# Patient Record
Sex: Female | Born: 2000 | Race: Black or African American | Hispanic: No | Marital: Married | Smoking: Never smoker
Health system: Southern US, Community
[De-identification: ages and names within clinical notes are randomized; demographics above are authoritative.]

## PROBLEM LIST (undated history)

## (undated) DIAGNOSIS — M2140 Flat foot [pes planus] (acquired), unspecified foot: Secondary | ICD-10-CM

## (undated) HISTORY — DX: Flat foot (pes planus) (acquired), unspecified foot: M21.40

## (undated) HISTORY — PX: OTHER SURGICAL HISTORY: SHX169

---
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
## 2018-02-25 DIAGNOSIS — N926 Irregular menstruation, unspecified: Secondary | ICD-10-CM | POA: Diagnosis not present

## 2018-02-25 DIAGNOSIS — Z3202 Encounter for pregnancy test, result negative: Secondary | ICD-10-CM | POA: Diagnosis not present

## 2018-02-25 DIAGNOSIS — Z6825 Body mass index (BMI) 25.0-25.9, adult: Secondary | ICD-10-CM | POA: Diagnosis not present

## 2018-03-26 DIAGNOSIS — Z6825 Body mass index (BMI) 25.0-25.9, adult: Secondary | ICD-10-CM | POA: Diagnosis not present

## 2018-03-26 DIAGNOSIS — N939 Abnormal uterine and vaginal bleeding, unspecified: Secondary | ICD-10-CM | POA: Diagnosis not present

## 2018-10-24 IMAGING — DX DG CHEST 2V
2 series · 2 of 2 positions shown · non-contrast
Comparison: Report 07/08/2000

CLINICAL DATA: Chest pain, sharp in central

EXAM:
CHEST  2 VIEW

[chest pa]
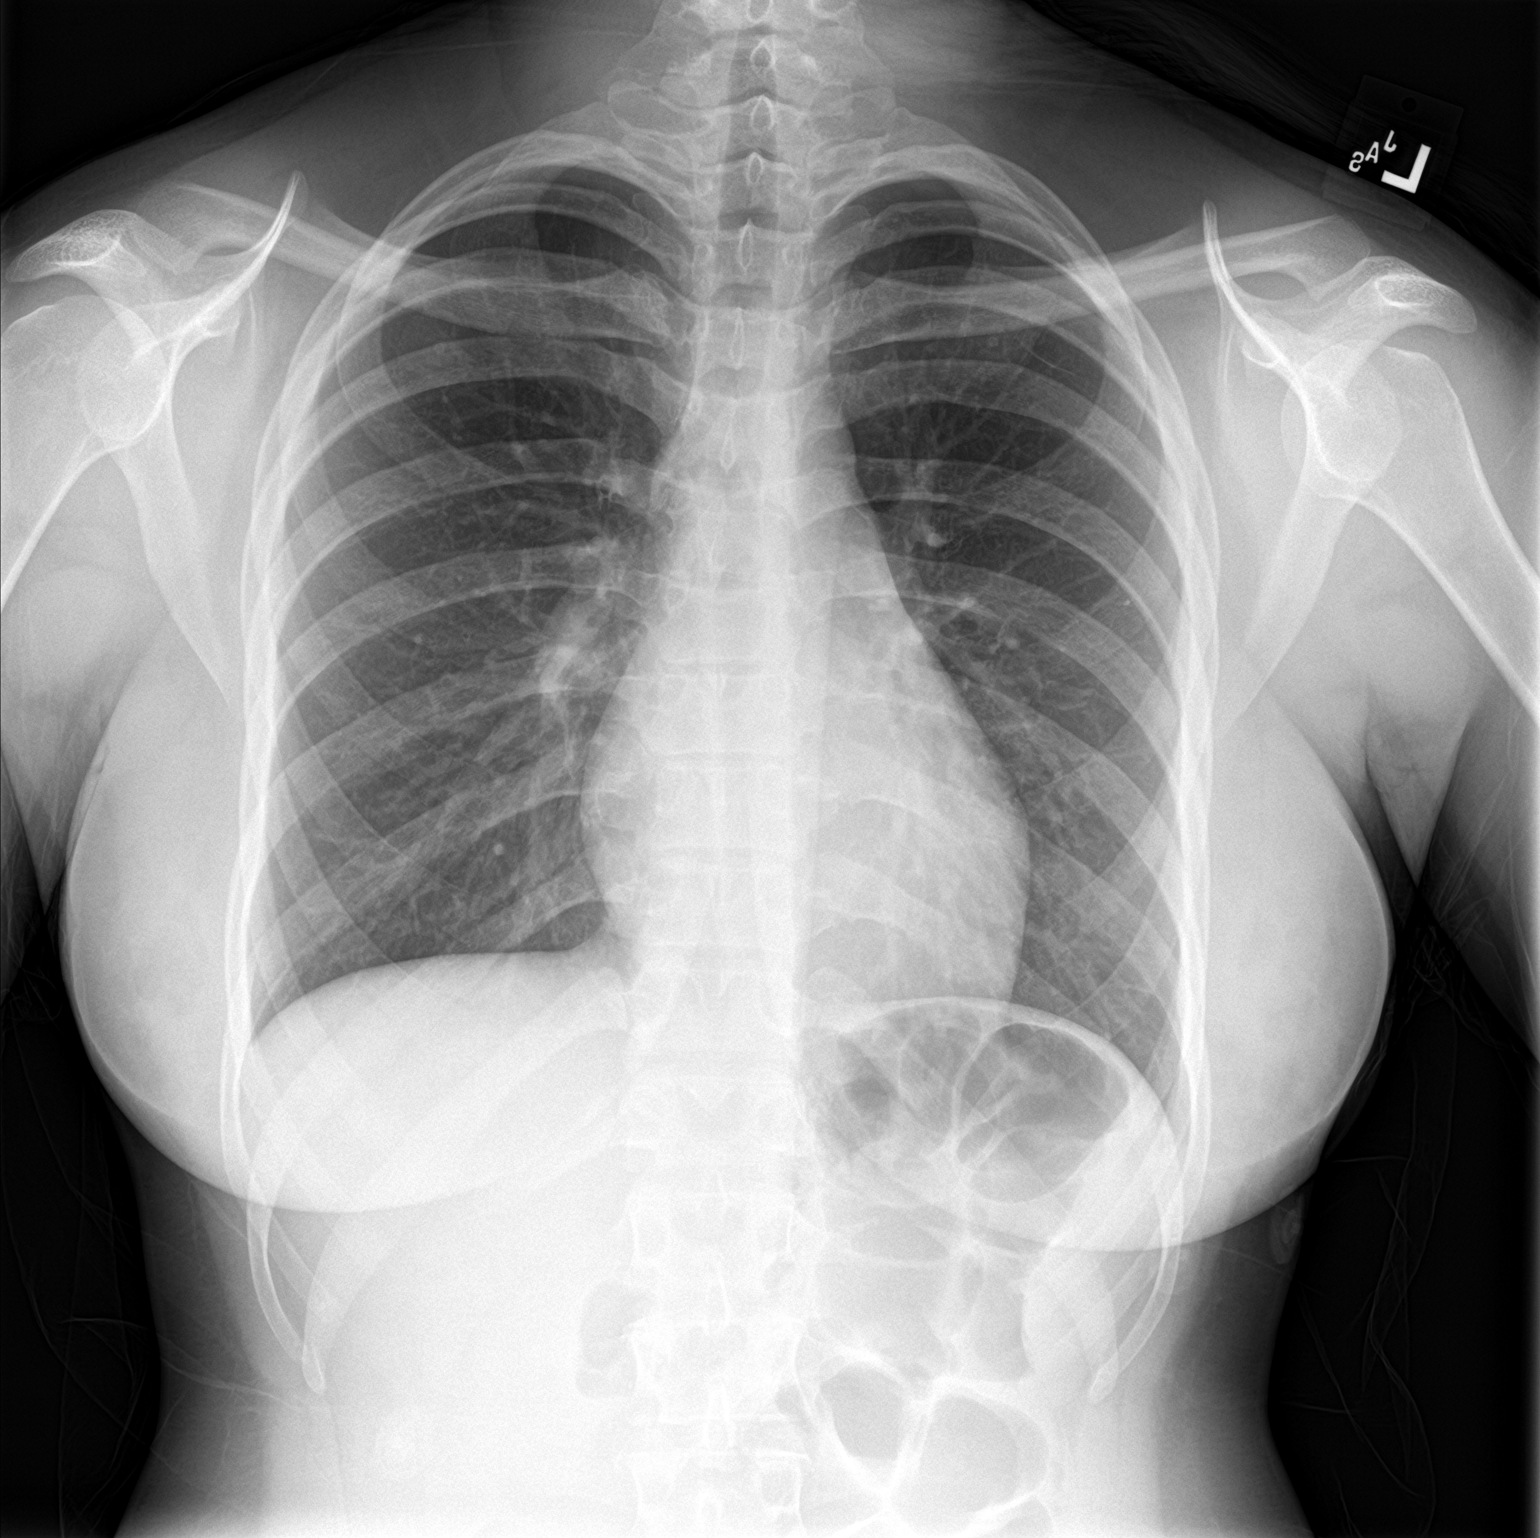

[chest lat]
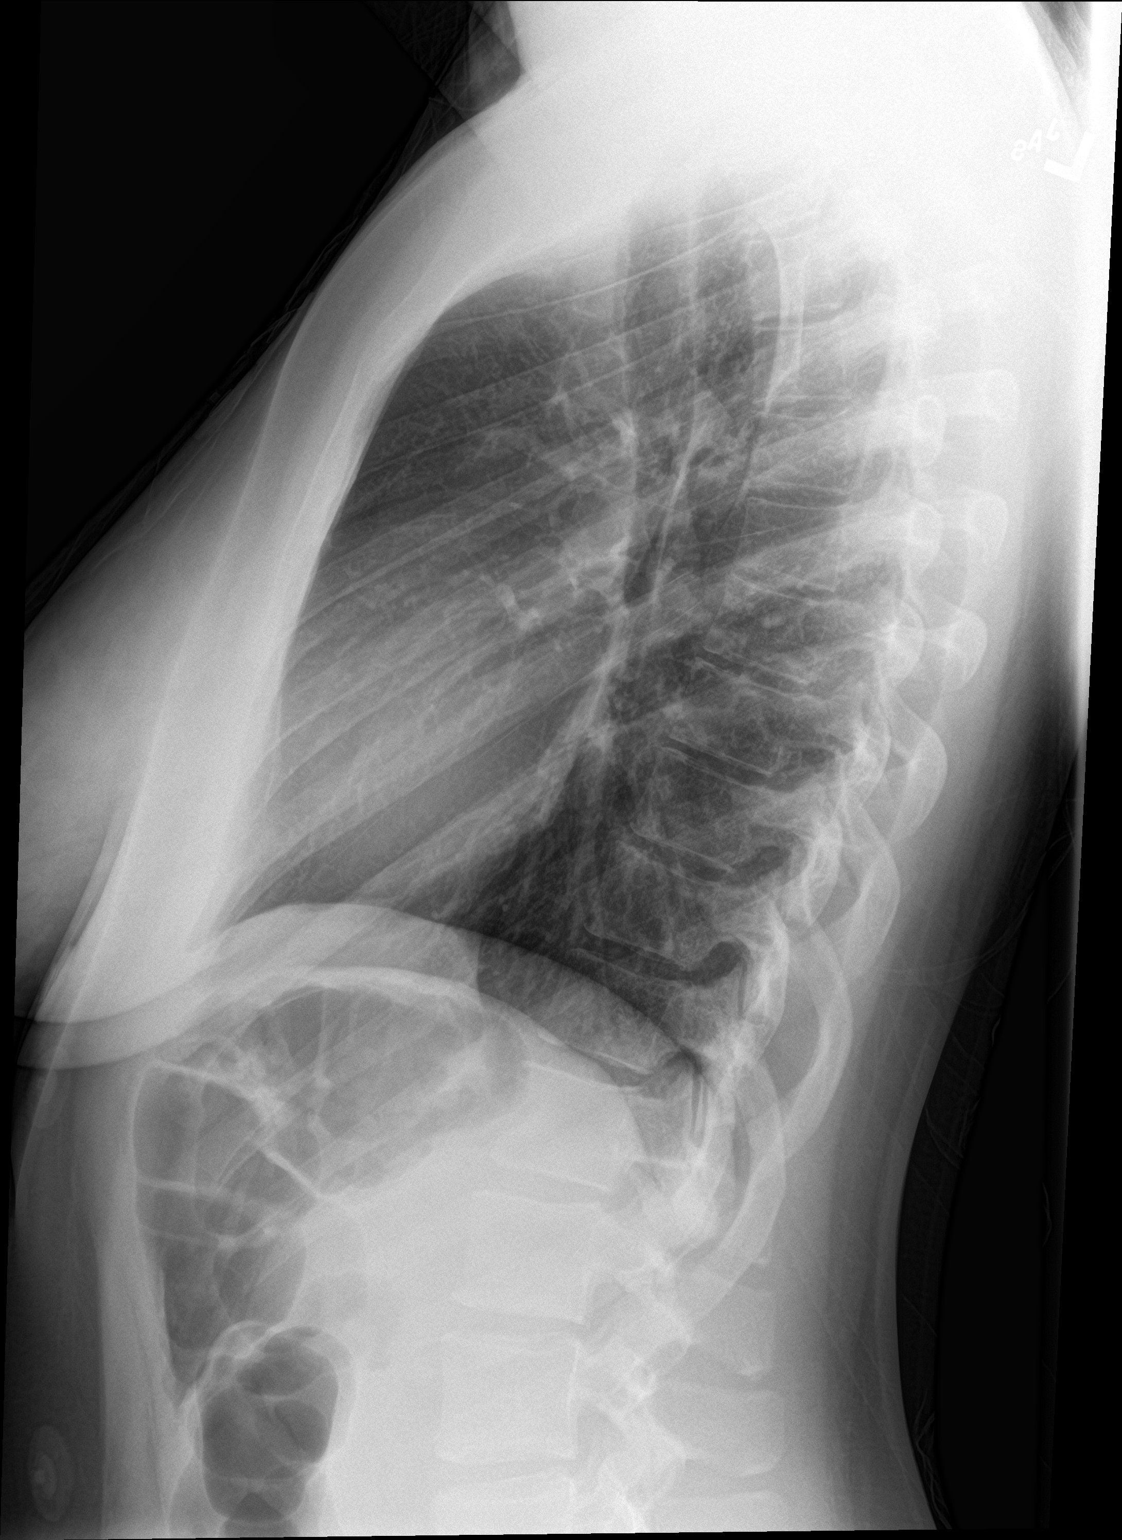

[2 of 2 positions shown; findings below may reference images not displayed]

FINDINGS: The heart size and mediastinal contours are within normal limits.
Both lungs are clear. The visualized skeletal structures are
unremarkable.
IMPRESSION: No active cardiopulmonary disease.

## 2018-12-03 DIAGNOSIS — Z118 Encounter for screening for other infectious and parasitic diseases: Secondary | ICD-10-CM | POA: Diagnosis not present

## 2018-12-03 DIAGNOSIS — Z3169 Encounter for other general counseling and advice on procreation: Secondary | ICD-10-CM | POA: Diagnosis not present

## 2018-12-03 DIAGNOSIS — N970 Female infertility associated with anovulation: Secondary | ICD-10-CM | POA: Diagnosis not present

## 2018-12-03 DIAGNOSIS — Z6825 Body mass index (BMI) 25.0-25.9, adult: Secondary | ICD-10-CM | POA: Diagnosis not present

## 2018-12-08 IMAGING — CT CT ABD-PELV W/O CM
2 of 4 series · 15 of 46 positions shown, 17 images · non-contrast
Comparison: None.

CLINICAL DATA: Right lower quadrant pain, vomiting.

EXAM:
CT ABDOMEN AND PELVIS WITHOUT CONTRAST
TECHNIQUE: Multidetector CT imaging of the abdomen and pelvis was performed
following the standard protocol without IV contrast.

[Series 3: renal stone 5mm · axial · 0.59mm/px · z∈[+645,+1010]mm · 12 of 87 slices shown, 14 images]
[im 7/87  soft-tissue]
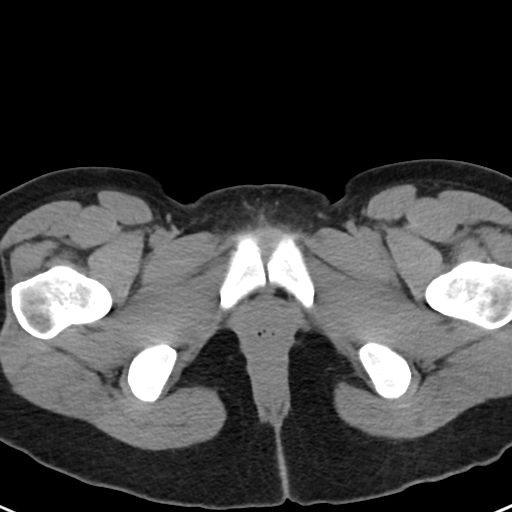
[im 7/87  bone]
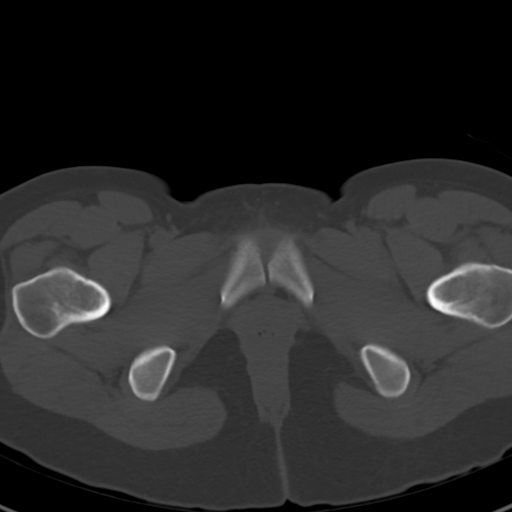
[im 14/87  soft-tissue]
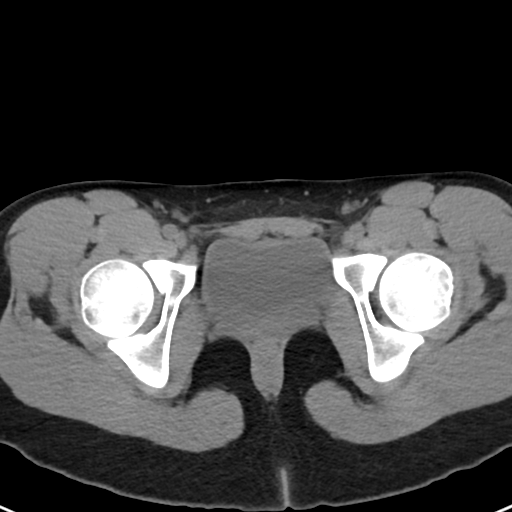
[im 20/87  soft-tissue]
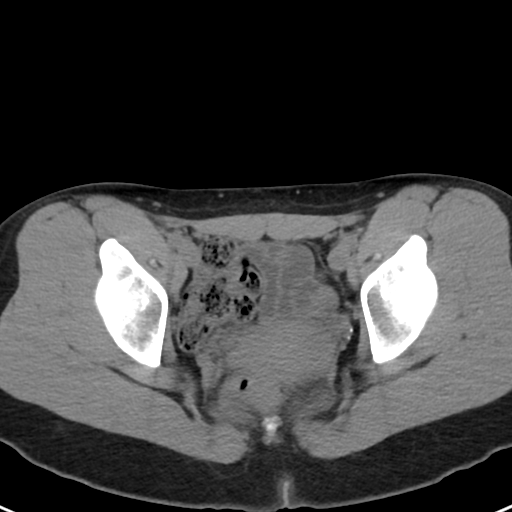
[im 27/87  soft-tissue]
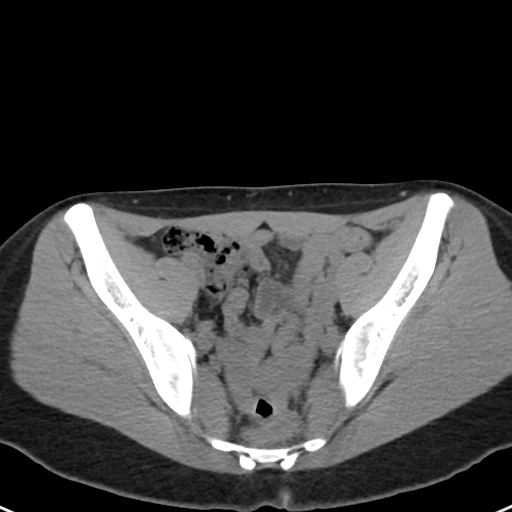
[im 34/87  soft-tissue]
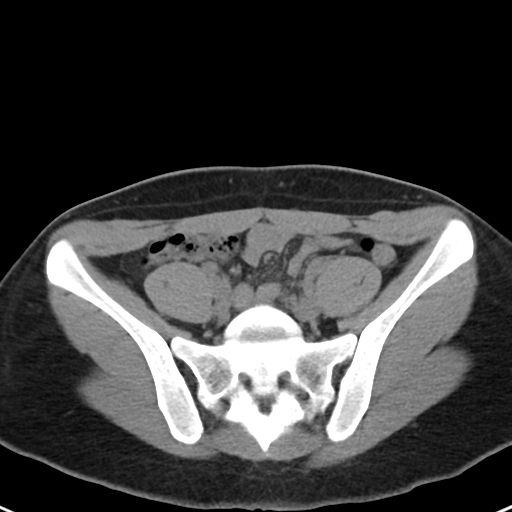
[im 40/87  soft-tissue]
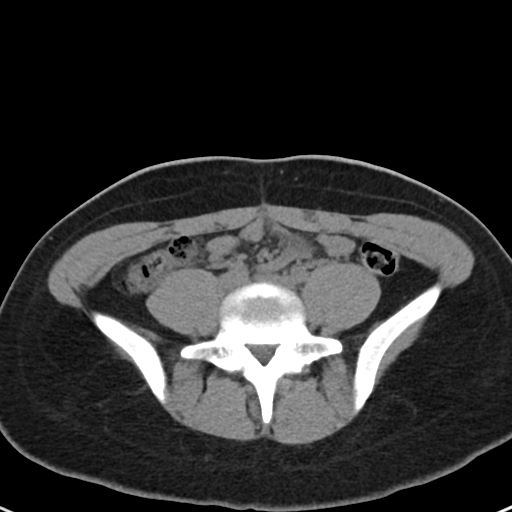
[im 47/87  soft-tissue]
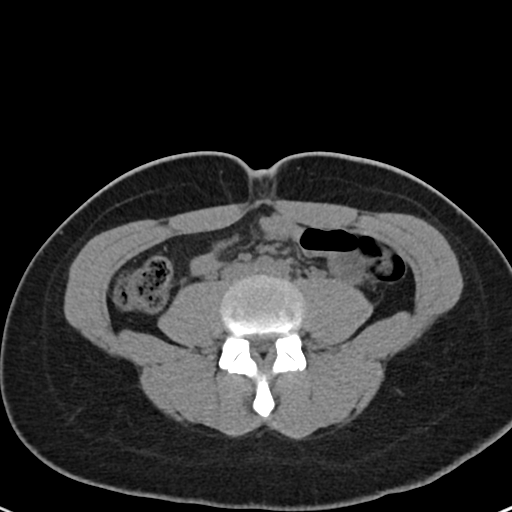
[im 53/87  soft-tissue]
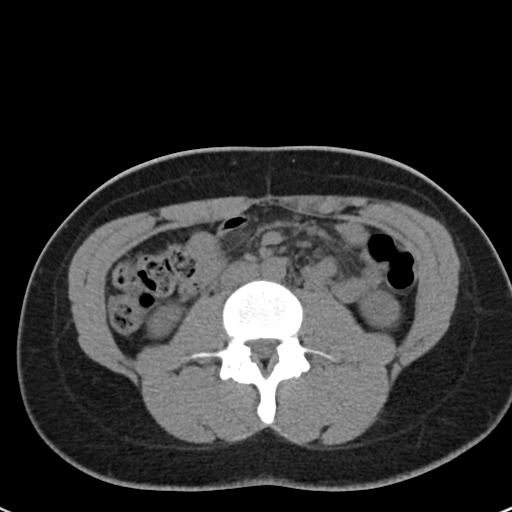
[im 60/87  soft-tissue]
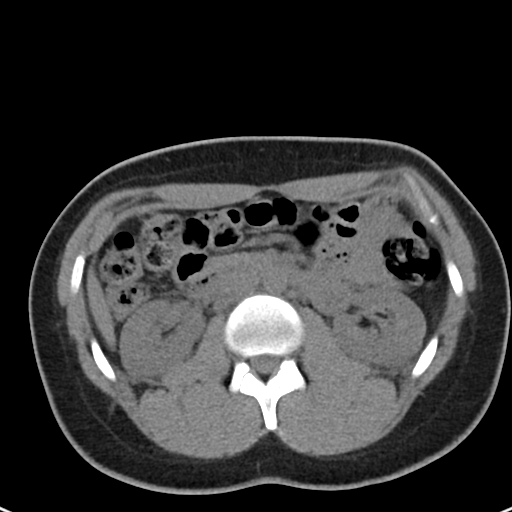
[im 60/87  bone]
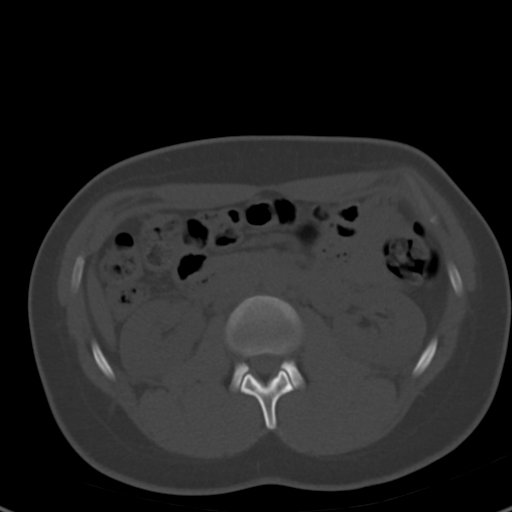
[im 67/87  soft-tissue]
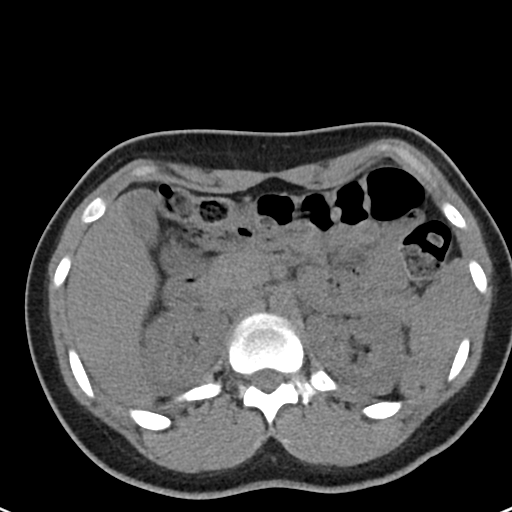
[im 73/87  soft-tissue]
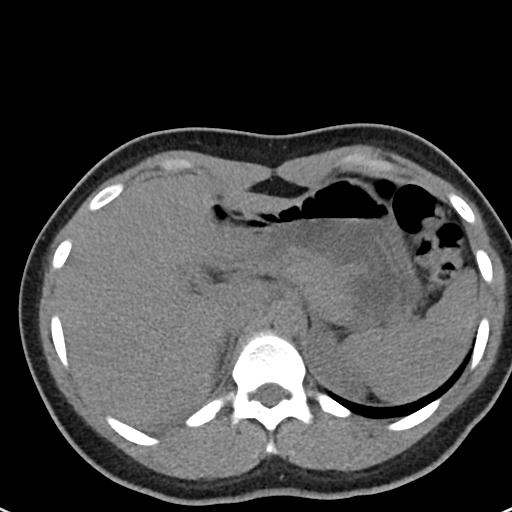
[im 80/87  soft-tissue]
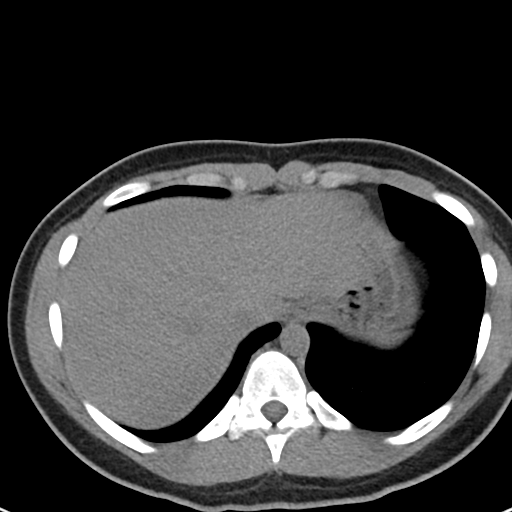

[Series 5: renal stone 3.0 cor · coronal · 0.59mm/px · 3 of 74 slices shown]
[im 25/74  soft-tissue]
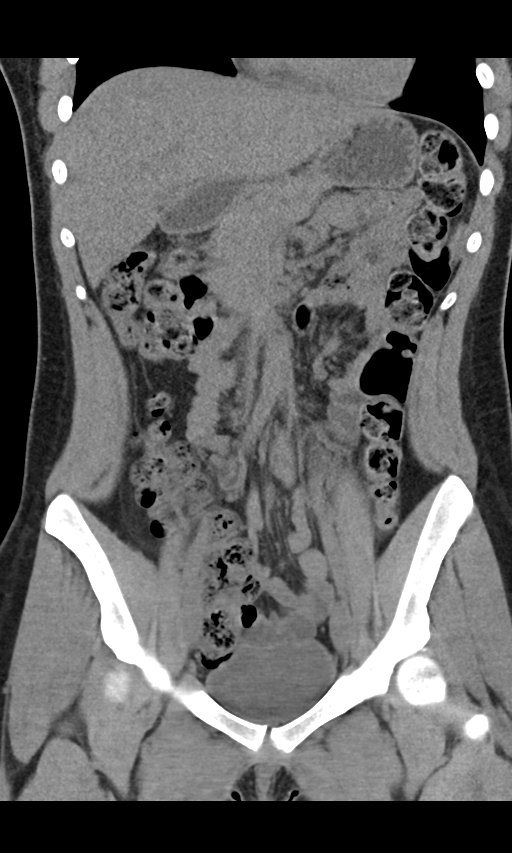
[im 33/74  soft-tissue]
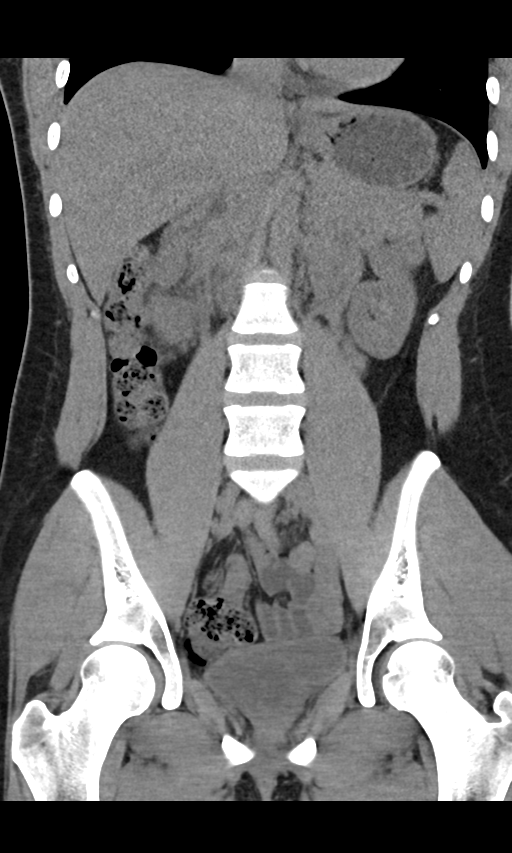
[im 41/74  soft-tissue]
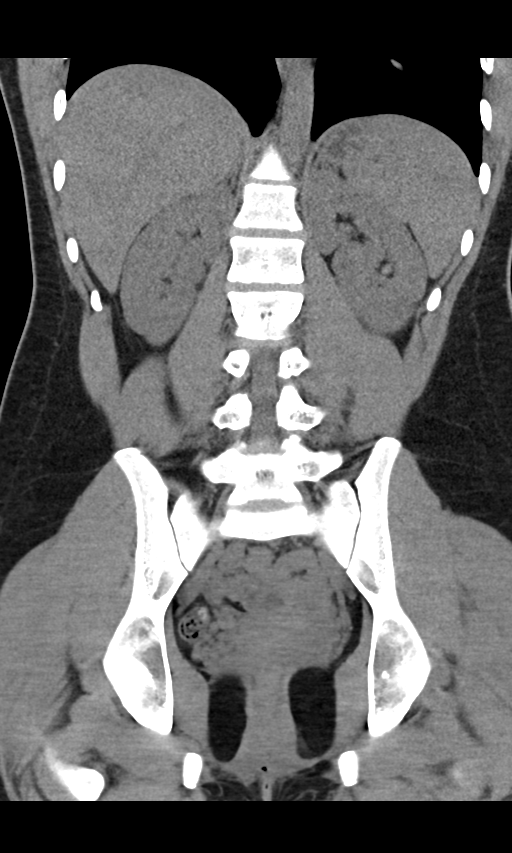

[15 of 46 positions shown; findings below may reference images not displayed]

FINDINGS: Lower chest: Lung bases are clear. No effusions. Heart is normal
size.

Hepatobiliary: No focal hepatic abnormality. Gallbladder
unremarkable.

Pancreas: No focal abnormality or ductal dilatation.

Spleen: No focal abnormality.  Normal size.

Adrenals/Urinary Tract: No adrenal abnormality. No focal renal
abnormality. No stones or hydronephrosis. Urinary bladder is
unremarkable.

Stomach/Bowel: Appendix is visualized and is no. Stomach, large and
small bowel grossly unremarkable.

Vascular/Lymphatic: No evidence of aneurysm or adenopathy.

Reproductive: Uterus and adnexa unremarkable.  No mass.

Other: No free fluid or free air.

Musculoskeletal: No acute bony abnormality or focal bone lesion.
IMPRESSION: No renal or ureteral stones.  No hydronephrosis.

Normal appendix.

No acute findings in the abdomen or pelvis.

## 2019-03-11 ENCOUNTER — Other Ambulatory Visit: Payer: Self-pay

## 2019-03-11 DIAGNOSIS — Z20822 Contact with and (suspected) exposure to covid-19: Secondary | ICD-10-CM

## 2019-03-12 LAB — NOVEL CORONAVIRUS, NAA: SARS-CoV-2, NAA: NOT DETECTED

## 2019-03-27 ENCOUNTER — Ambulatory Visit (INDEPENDENT_AMBULATORY_CARE_PROVIDER_SITE_OTHER): Payer: BC Managed Care – PPO | Admitting: Family Medicine

## 2019-03-27 ENCOUNTER — Other Ambulatory Visit: Payer: Self-pay

## 2019-03-27 ENCOUNTER — Encounter: Payer: Self-pay | Admitting: Family Medicine

## 2019-03-27 VITALS — BP 124/78 | HR 65 | Temp 97.9°F | Ht 65.5 in | Wt 156.6 lb

## 2019-03-27 DIAGNOSIS — L700 Acne vulgaris: Secondary | ICD-10-CM | POA: Diagnosis not present

## 2019-03-27 DIAGNOSIS — L309 Dermatitis, unspecified: Secondary | ICD-10-CM

## 2019-03-27 DIAGNOSIS — Z23 Encounter for immunization: Secondary | ICD-10-CM | POA: Diagnosis not present

## 2019-03-27 MED ORDER — TRIAMCINOLONE ACETONIDE 0.1 % EX CREA: 1.0000 "application " | TOPICAL_CREAM | Freq: Two times a day (BID) | CUTANEOUS | 0 refills | Status: DC

## 2019-03-27 NOTE — Progress Notes (Signed)
Subjective:    Patient ID: Kristen Cain, female    DOB: 07-02-2000, 18 y.o.   MRN: [id number]  HPI Pt presents requesting a dermatology ref  Also needs a flu shot   Smoking status -she smokes now and then- marijuana  Not dependent  No cigarettes  Getting some eczema  Areas itch and burn   Dry patches on hands - get hypopigmented  Lotion- any kind - tries to stay moisturize Uses dove soap  Does take hot showers   No allergies or asthma  (just asthma as a kid)  Has never had eczema before   She would also like to address dark spots on face  She scars easily  Had acne growing up (all on face)  Gets it now and then   Patient Active Problem List   Diagnosis Date Noted  . Eczema of both hands 03/27/2019  . Acne vulgaris 03/27/2019  . Nausea 10/25/2017  . Routine general medical examination at a health care facility 01/02/2014  . Screen for STD (sexually transmitted disease) 01/02/2014  . Menorrhagia with irregular cycle 01/02/2014  . PES PLANUS 11/29/2009   Past Medical History:  Diagnosis Date  . Asthma   . 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)    Past Surgical History:  Procedure Laterality Date  . toe nail     Social History   Tobacco Use  . Smoking status: Never Smoker  . Smokeless tobacco: Never Used  Substance Use Topics  . Alcohol use: Yes    Alcohol/week: 0.0 standard drinks    Comment: occ  . Drug use: Yes    Types: Marijuana   Family History  Problem Relation Age of Onset  . Hypertension Father   . Hypertension Mother   . Diabetes Mother        borderline   No Known Allergies No current outpatient medications on file prior to visit.   No current facility-administered medications on file prior to visit.     Review of Systems  Constitutional: Negative for activity change, appetite change, fatigue, fever and unexpected weight change.  HENT: Negative for congestion, ear pain, rhinorrhea, sinus pressure and sore throat.   Eyes: Negative for pain, redness and  visual disturbance.  Respiratory: Negative for cough, shortness of breath and wheezing.   Cardiovascular: Negative for chest pain and palpitations.  Gastrointestinal: Negative for abdominal pain, blood in stool, constipation and diarrhea.  Endocrine: Negative for polydipsia and polyuria.  Genitourinary: Negative for dysuria, frequency and urgency.  Musculoskeletal: Negative for arthralgias, back pain and myalgias.  Skin: Positive for rash. Negative for pallor.  Allergic/Immunologic: Negative for environmental allergies.  Neurological: Negative for dizziness, syncope and headaches.  Hematological: Negative for adenopathy. Does not bruise/bleed easily.  Psychiatric/Behavioral: Negative for decreased concentration and dysphoric mood. The patient is not nervous/anxious.        Objective:   Physical Exam Constitutional:      General: She is not in acute distress.    Appearance: Normal appearance. She is normal weight. She is not ill-appearing.  HENT:     Head: Normocephalic and atraumatic.     Mouth/Throat:     Mouth: Mucous membranes are moist.  Eyes:     General: No scleral icterus.    Extraocular Movements: Extraocular movements intact.     Conjunctiva/sclera: Conjunctivae normal.     Pupils: Pupils are equal, round, and reactive to light.  Neck:     Musculoskeletal: Normal range of motion and neck supple.  Cardiovascular:  Rate and Rhythm: Normal rate and regular rhythm.  Pulmonary:     Effort: Pulmonary effort is normal. No respiratory distress.     Breath sounds: Normal breath sounds. No wheezing.  Lymphadenopathy:     Cervical: No cervical adenopathy.  Skin:    General: Skin is warm and dry.     Findings: Rash present. No erythema.     Comments: Several patches of dry skin resembling eczema on dorsal hands with mild hypopigmentation   Facial acne with comedones/ no pustules   Neurological:     Mental Status: She is alert. Mental status is at baseline.  Psychiatric:         Mood and Affect: Mood normal.           Assessment & Plan:   Problem List Items Addressed This Visit      Musculoskeletal and Integument   Eczema of both hands - Primary    Patchy/dry  Remote hx of asthma  Recommend dove soap for sens skin  Unscented moisturizers Avoid hot water/harsh chemicals (use gloves for dishes)  Triamcinolone px for prn use Ref to dermatology      Relevant Orders   Ambulatory referral to Dermatology   Acne vulgaris    Recommended non comedogenic products  Consider a neutral gel neutrogena cleanser  Mask has made it worse  Ref to dermatology for further management       Relevant Orders   Ambulatory referral to Dermatology    Other Visit Diagnoses    Need for influenza vaccination       Relevant Orders   Flu Vaccine QUAD 6+ mos PF IM (Fluarix Quad PF) (Completed)

## 2019-03-27 NOTE — Assessment & Plan Note (Signed)
Patchy/dry  Remote hx of asthma  Recommend dove soap for sens skin  Unscented moisturizers Avoid hot water/harsh chemicals (use gloves for dishes)  Triamcinolone px for prn use Ref to dermatology

## 2019-03-27 NOTE — Patient Instructions (Addendum)
Use scent free moisturizers - eucerin/ lubriderm   For now put the scented products away  Get some gloves for doing dishes  I like the "true blue" gloves   I sent some triamcinolone cream to [facility] to try on the problem areas of eczema /hands   We will refer you to dermatology Our office will call you to set that up   Flu shot today

## 2019-03-27 NOTE — Assessment & Plan Note (Signed)
Recommended non comedogenic products  Consider a neutral gel neutrogena cleanser  Mask has made it worse  Ref to dermatology for further management

## 2019-03-28 ENCOUNTER — Encounter: Payer: Self-pay | Admitting: Family Medicine

## 2019-04-08 ENCOUNTER — Ambulatory Visit (INDEPENDENT_AMBULATORY_CARE_PROVIDER_SITE_OTHER): Payer: BC Managed Care – PPO | Admitting: Family Medicine

## 2019-04-08 ENCOUNTER — Other Ambulatory Visit: Payer: Self-pay

## 2019-04-08 ENCOUNTER — Encounter: Payer: Self-pay | Admitting: Family Medicine

## 2019-04-08 VITALS — BP 116/62 | HR 67 | Temp 97.5°F | Ht 65.0 in | Wt 160.2 lb

## 2019-04-08 DIAGNOSIS — L700 Acne vulgaris: Secondary | ICD-10-CM | POA: Diagnosis not present

## 2019-04-08 DIAGNOSIS — L720 Epidermal cyst: Secondary | ICD-10-CM | POA: Insufficient documentation

## 2019-04-08 NOTE — Progress Notes (Signed)
Subjective:    Patient ID: Kristen Cain, female    DOB: [birth year], 18 y.o.   MRN: [id number]  HPI Pt presents for a spot on her L eye   She has a bump in/on medial eye It is getting bigger - noticeable Bothersome but not painful  No drainage that she can see   She can see it if the turns eyes to the R   Also has acne - cheeks / some darker areas and scars  occ pustule (not often) Uses cetaphil cleanser and water  Would like to see dermatology  Patient Active Problem List   Diagnosis Date Noted  . Epidermal cyst of face 04/08/2019  . Eczema of both hands 03/27/2019  . Acne vulgaris 03/27/2019  . Routine general medical examination at a health care facility 01/02/2014  . Screen for STD (sexually transmitted disease) 01/02/2014  . Menorrhagia with irregular cycle 01/02/2014  . PES PLANUS 11/29/2009   Past Medical History:  Diagnosis Date  . Asthma   . 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)    Past Surgical History:  Procedure Laterality Date  . toe nail     Social History   Tobacco Use  . Smoking status: Never Smoker  . Smokeless tobacco: Never Used  Substance Use Topics  . Alcohol use: Yes    Alcohol/week: 0.0 standard drinks    Comment: occ  . Drug use: Yes    Types: Marijuana   Family History  Problem Relation Age of Onset  . Hypertension Father   . Hypertension Mother   . Diabetes Mother        borderline   No Known Allergies Current Outpatient Medications on File Prior to Visit  Medication Sig Dispense Refill  . triamcinolone cream (KENALOG) 0.1 % Apply 1 application topically 2 (two) times daily. To affected eczema areas 30 g 0   No current facility-administered medications on file prior to visit.     Review of Systems  Constitutional: Negative for activity change, appetite change, fatigue, fever and unexpected weight change.  HENT: Negative for congestion, ear pain, rhinorrhea, sinus pressure and sore throat.   Eyes: Negative for pain, redness and visual  disturbance.  Respiratory: Negative for cough, shortness of breath and wheezing.   Cardiovascular: Negative for chest pain and palpitations.  Gastrointestinal: Negative for abdominal pain, blood in stool, constipation and diarrhea.  Endocrine: Negative for polydipsia and polyuria.  Genitourinary: Negative for dysuria, frequency and urgency.  Musculoskeletal: Negative for arthralgias, back pain and myalgias.  Skin: Negative for pallor and rash.       Acne Lesion on lower eyelid white in color  Allergic/Immunologic: Negative for environmental allergies.  Neurological: Negative for dizziness, syncope and headaches.  Hematological: Negative for adenopathy. Does not bruise/bleed easily.  Psychiatric/Behavioral: Negative for decreased concentration and dysphoric mood. The patient is not nervous/anxious.        Objective:   Physical Exam Constitutional:      Appearance: Normal appearance. She is normal weight. She is not ill-appearing.  HENT:     Head: Normocephalic and atraumatic.     Mouth/Throat:     Mouth: Mucous membranes are moist.  Eyes:     General: No scleral icterus.       Right eye: No discharge.        Left eye: No discharge.     Extraocular Movements: Extraocular movements intact.     Conjunctiva/sclera: Conjunctivae normal.     Pupils: Pupils are equal, round, and reactive to light.  Neck:     Musculoskeletal: Normal range of motion.  Cardiovascular:     Rate and Rhythm: Normal rate and regular rhythm.  Pulmonary:     Effort: Pulmonary effort is normal. No respiratory distress.  Lymphadenopathy:     Cervical: No cervical adenopathy.  Skin:    General: Skin is warm and dry.     Coloration: Skin is not pale.     Findings: No erythema or rash.     Comments: Small 1-2 mm white cyst at inner corner of L eye (lower lid) Small amt of white material expressed when pressure with tweezers is applied (is tender)  No erythema or swelling  Mild comedonal acne with some  scarring and hyperpigmentation on face (worse on cheeks)    Neurological:     Mental Status: She is alert.     Cranial Nerves: No cranial nerve deficit.  Psychiatric:        Mood and Affect: Mood normal.           Assessment & Plan:   Problem List Items Addressed This Visit      Musculoskeletal and Integument   Acne vulgaris    Mild to moderate come donal acne on chin/cheeks  Some scarring and hyperpigmented areas  inst to continue gentle cleanser (cetaphil) Can use a soft wash cloth or baking soda and water to exfoliate gently  Ref made to dermatology for further eval/tx      Relevant Orders   Ambulatory referral to Dermatology   Epidermal cyst of face - Primary    Tiny cyst on lower L eyelid at medial corner Suspect epidermal inclusion cyst Able to express small amt of white material with tweezers with some discomfort  Will watch for s/s of infection  Ref to dermatology to see if it can be removed or drained further       Relevant Orders   Ambulatory referral to Dermatology

## 2019-04-08 NOTE — Assessment & Plan Note (Addendum)
Tiny cyst on lower L eyelid at medial corner Suspect epidermal inclusion cyst Able to express small amt of white material with tweezers with some discomfort  Will watch for s/s of infection  Ref to dermatology to see if it can be removed or drained further

## 2019-04-08 NOTE — Assessment & Plan Note (Signed)
Mild to moderate come donal acne on chin/cheeks  Some scarring and hyperpigmented areas  inst to continue gentle cleanser (cetaphil) Can use a soft wash cloth or baking soda and water to exfoliate gently  Ref made to dermatology for further eval/tx

## 2019-04-08 NOTE — Patient Instructions (Signed)
Continue your current cleanser  Baking soda and water or a clean wash cloth- helps exfoliate but not not scrub hard   We will refer you to dermatology for eye area cyst and acne Our office will call you to set that up   Try warm compress on cyst near eye  It may help it drain  If it turns red or painful let me know

## 2019-04-21 DIAGNOSIS — L700 Acne vulgaris: Secondary | ICD-10-CM | POA: Diagnosis not present

## 2019-05-23 NOTE — L&D Delivery Note (Signed)
Operative Delivery Note At 12:54 PM a viable and healthy female was delivered via Vaginal, Vacuum Investment banker, operational).  Presentation: vertex; Position: Occiput,, Anterior; Station: +3.  Verbal consent: obtained from patient.  Risks and benefits discussed in detail.  Risks include, but are not limited to the risks of anesthesia, bleeding, infection, damage to maternal tissues, fetal cephalhematoma.  There is also the risk of inability to effect vaginal delivery of the head, or shoulder dystocia that cannot be resolved by established maneuvers, leading to the need for emergency cesarean section. Indication: terminal bradycardia APGAR: 9, 9; weight pending .   Placenta status: , .   Cord:  with the following complications: short.  Cord pH: n/a  Anesthesia:  epidural Instruments: Kiwi vacuum Episiotomy: None Lacerations: Labial minora bilateral Suture Repair: 3.0 chromic Est. Blood Loss (mL):  200  Mom to postpartum.  Baby to Couplet care / Skin to Skin.  Kalley Nicholl A Keren Alverio 04/01/2020, 1:18 PM

## 2019-08-04 ENCOUNTER — Encounter: Payer: Self-pay | Admitting: Family Medicine

## 2019-08-04 ENCOUNTER — Telehealth: Payer: Self-pay

## 2019-08-04 ENCOUNTER — Ambulatory Visit (INDEPENDENT_AMBULATORY_CARE_PROVIDER_SITE_OTHER): Payer: BC Managed Care – PPO | Admitting: Family Medicine

## 2019-08-04 VITALS — Temp 98.6°F

## 2019-08-04 DIAGNOSIS — N926 Irregular menstruation, unspecified: Secondary | ICD-10-CM | POA: Diagnosis not present

## 2019-08-04 DIAGNOSIS — J069 Acute upper respiratory infection, unspecified: Secondary | ICD-10-CM | POA: Insufficient documentation

## 2019-08-04 NOTE — Telephone Encounter (Signed)
Pt notified of Dr. Royden Purl comments. She will also f/u with GYN regarding the positive pregnancy test

## 2019-08-04 NOTE — Assessment & Plan Note (Addendum)
And some nausea  Will check home preg test  Also get tested for covid (given [facility])  inst to call us with results of both  Watching for sinus pain -with purulent nasal d/c may need tx for sinusitis inst to drink fluids and rest Acetaminophen if needed  Will update soon

## 2019-08-04 NOTE — Telephone Encounter (Signed)
Thanks for letting me know and congratulations! Do please also get a covid test (due to respiratory symptoms)

## 2019-08-04 NOTE — Progress Notes (Signed)
Virtual Visit via Video Note  I connected with Kristen Cain on 08/04/19 at  3:00 PM EDT by a video enabled telemedicine application and verified that I am speaking with the correct person using two identifiers.  Location: Patient: home Provider: office    I discussed the limitations of evaluation and management by telemedicine and the availability of in person appointments. The patient expressed understanding and agreed to proceed.  Parties involved in encounter  Patient: Kristen Cain  Provider:  Roxy Manns MD    History of Present Illness: Pt presents with cough and nasal congestion and vomiting   LMP was 06/29/19-tends to be irregular  There is a chance of pregnancy  Wants to become pregnant    Has been a little congested lately (over 2 weeks) Now cough- lot of mucous / green mucous from nose  Had her covid vaccine - first moderna on Saturday    Very tired  Woke up nauseated and vomited middle of the night   Thinks she has an upper resp infection   No fever or chills  No throat or ear pain  No headache (had for first few days)  A little sinus pressure under eyes   Checked her temperature during the visit  98.4    OTC mucinex-in the beginning  Nothing now (on the off chance she is pregnant)   Patient Active Problem List   Diagnosis Date Noted  . Viral URI with cough 08/04/2019  . Missed menses 08/04/2019  . Epidermal cyst of face 04/08/2019  . Eczema of both hands 03/27/2019  . Acne vulgaris 03/27/2019  . Routine general medical examination at a health care facility 01/02/2014  . Screen for STD (sexually transmitted disease) 01/02/2014  . Menorrhagia with irregular cycle 01/02/2014  . PES PLANUS 11/29/2009   Past Medical History:  Diagnosis Date  . Asthma   . 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)    Past Surgical History:  Procedure Laterality Date  . toe nail     Social History   Tobacco Use  . Smoking status: Never Smoker  . Smokeless tobacco: Never Used   Substance Use Topics  . Alcohol use: Yes    Alcohol/week: 0.0 standard drinks    Comment: occ  . Drug use: Yes    Types: Marijuana   Family History  Problem Relation Age of Onset  . Hypertension Father   . Hypertension Mother   . Diabetes Mother        borderline   No Known Allergies Current Outpatient Medications on File Prior to Visit  Medication Sig Dispense Refill  . triamcinolone cream (KENALOG) 0.1 % Apply 1 application topically 2 (two) times daily. To affected eczema areas 30 g 0   No current facility-administered medications on file prior to visit.   Review of Systems  Constitutional: Positive for malaise/fatigue. Negative for chills and fever.  HENT: Positive for congestion. Negative for ear pain, sinus pain and sore throat.   Eyes: Negative for blurred vision, discharge and redness.  Respiratory: Positive for cough and sputum production. Negative for shortness of breath, wheezing and stridor.   Cardiovascular: Negative for chest pain, palpitations and leg swelling.  Gastrointestinal: Positive for nausea and vomiting. Negative for abdominal pain and diarrhea.  Genitourinary:       Missed menses -not unusual for her  Musculoskeletal: Negative for myalgias.  Skin: Negative for rash.  Neurological: Negative for dizziness and headaches.       Observations/Objective: Patient appears well, in no distress, but seems generally  fatigued  Weight is baseline  No facial swelling or asymmetry Normal voice-not hoarse and no slurred speech (sounds nasally congested)  No obvious tremor or mobility impairment Moving neck and UEs normally Able to hear the call well  No cough or shortness of breath during interview  Talkative and mentally sharp with no cognitive changes No skin changes on face or neck , no rash or pallor Affect is normal    Assessment and Plan: Problem List Items Addressed This Visit      Respiratory   Viral URI with cough - Primary    And some nausea   Will check home preg test  Also get tested for covid ([facility])  inst to call us with results of both  Watching for sinus pain -with purulent nasal d/c may need tx for sinusitis inst to drink fluids and rest Acetaminophen if needed  Will update soon        Other   Missed menses    Pt tends to be irregular but exp n/v now  ? If due to uri or if possible pregnancy  She does desire pregnancy  inst to get a home preg test and call us with results           Follow Up Instructions: Drink fluids and rest  Take a home pregnancy test and call us with results  Also get tested for covid and call us with results  Sip fluids and rest  Tylenol is ok for pain if needed Nasal saline for congestion If nausea/vomiting worsen please call  We may need to consider treatment for sinus infection if no improvement      I discussed the assessment and treatment plan with the patient. The patient was provided an opportunity to ask questions and all were answered. The patient agreed with the plan and demonstrated an understanding of the instructions.   The patient was advised to call back or seek an in-person evaluation if the symptoms worsen or if the condition fails to improve as anticipated.   Loura Pardon, MD

## 2019-08-04 NOTE — Assessment & Plan Note (Signed)
Pt tends to be irregular but exp n/v now  ? If due to uri or if possible pregnancy  She does desire pregnancy  inst to get a home preg test and call us with results

## 2019-08-04 NOTE — Patient Instructions (Signed)
Drink fluids and rest  Take a home pregnancy test and call us with results  Also get tested for covid and call us with results  Sip fluids and rest  Tylenol is ok for pain if needed Nasal saline for congestion If nausea/vomiting worsen please call  We may need to consider treatment for sinus infection if no improvement

## 2019-08-04 NOTE — Telephone Encounter (Signed)
Patient contacted the office. She states she was seen earlier today for a viral URI, and Dr. Milinda Antis had mentioned a COVID test - as well as a pregnancy test. She states she took an at home pregnancy test which did come back positive - which she states could explain her nausea. Patient states she is wondering if Dr. Milinda Antis still wants her to get a COVID test?

## 2019-08-05 DIAGNOSIS — Z3200 Encounter for pregnancy test, result unknown: Secondary | ICD-10-CM | POA: Diagnosis not present

## 2019-08-05 DIAGNOSIS — Z3689 Encounter for other specified antenatal screening: Secondary | ICD-10-CM | POA: Diagnosis not present

## 2019-08-07 ENCOUNTER — Inpatient Hospital Stay (HOSPITAL_COMMUNITY)
Admission: AD | Admit: 2019-08-07 | Discharge: 2019-08-08 | Disposition: A | Discharge status: Home / Self Care | Payer: BC Managed Care – PPO | Attending: Obstetrics & Gynecology | Admitting: Obstetrics & Gynecology

## 2019-08-07 ENCOUNTER — Other Ambulatory Visit: Payer: Self-pay

## 2019-08-07 ENCOUNTER — Encounter (HOSPITAL_COMMUNITY): Payer: Self-pay | Admitting: Obstetrics and Gynecology

## 2019-08-07 DIAGNOSIS — O219 Vomiting of pregnancy, unspecified: Secondary | ICD-10-CM | POA: Diagnosis not present

## 2019-08-07 DIAGNOSIS — Z3A01 Less than 8 weeks gestation of pregnancy: Secondary | ICD-10-CM | POA: Diagnosis not present

## 2019-08-07 DIAGNOSIS — O211 Hyperemesis gravidarum with metabolic disturbance: Secondary | ICD-10-CM | POA: Diagnosis not present

## 2019-08-07 DIAGNOSIS — O210 Mild hyperemesis gravidarum: Secondary | ICD-10-CM | POA: Diagnosis not present

## 2019-08-07 LAB — URINALYSIS, ROUTINE W REFLEX MICROSCOPIC
Bilirubin Urine: NEGATIVE
Glucose, UA: NEGATIVE mg/dL
Ketones, ur: 80 mg/dL — AB
Leukocytes,Ua: NEGATIVE
Nitrite: NEGATIVE
Protein, ur: 100 mg/dL — AB
Specific Gravity, Urine: 1.036 — ABNORMAL HIGH (ref 1.005–1.030)
pH: 6 (ref 5.0–8.0)

## 2019-08-07 LAB — POCT PREGNANCY, URINE: Preg Test, Ur: POSITIVE — AB

## 2019-08-07 MED ORDER — METOCLOPRAMIDE HCL 5 MG/ML IJ SOLN
5.0000 mg | Freq: Once | INTRAMUSCULAR | Status: AC
  Administered 2019-08-07: 5 mg via INTRAVENOUS
  Filled 2019-08-07: qty 2

## 2019-08-07 MED ORDER — PROMETHAZINE HCL 25 MG/ML IJ SOLN
12.5000 mg | Freq: Once | INTRAMUSCULAR | Status: AC
  Administered 2019-08-07: 12.5 mg via INTRAVENOUS
  Filled 2019-08-07: qty 1

## 2019-08-07 MED ORDER — DEXAMETHASONE SODIUM PHOSPHATE 10 MG/ML IJ SOLN
10.0000 mg | Freq: Once | INTRAMUSCULAR | Status: AC
  Administered 2019-08-07: 10 mg via INTRAVENOUS
  Filled 2019-08-07: qty 1

## 2019-08-07 MED ORDER — LACTATED RINGERS IV SOLN: INTRAVENOUS | Status: DC

## 2019-08-07 NOTE — MAU Note (Signed)
Patient states she had a + hpt on Monday, then confirmed in the office on Tuesday.  States they prescribed her promethazine and vit B6 but since then she has been unable to keep anything down.  Denies VB/discharge.  No abdominal pain.  LMP 06/29/2019.

## 2019-08-07 NOTE — MAU Provider Note (Signed)
Chief Complaint: Emesis and Nausea   First Provider Initiated Contact with Patient 08/07/19 2033        SUBJECTIVE HPI: Kristen Cain is a 19 y.o. G1P0 at [redacted]w[redacted]d by LMP who presents to maternity admissions reporting nausea and vomiting for several days.  Was prescribed Promethazine and B6.   Took Phenergan at 7am and fell asleep.  Took B6 and vomited it.  . She denies vaginal bleeding, vaginal itching/burning, urinary symptoms, h/a, dizziness, or fever/chills.    Emesis  This is a new problem. The current episode started in the past 7 days. The problem occurs 2 to 4 times per day. The problem has been unchanged. There has been no fever. Pertinent negatives include no abdominal pain, chills, diarrhea, dizziness or fever. Treatments tried: phenergan. The treatment provided mild relief.   RN Note: Patient states she had a + hpt on Monday, then confirmed in the office on Tuesday.  States they prescribed her promethazine and vit B6 but since then she has been unable to keep anything down.  Denies VB/discharge.  No abdominal pain.  LMP 06/29/2019.    Past Medical History:  Diagnosis Date  . Asthma   . 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)    Past Surgical History:  Procedure Laterality Date  . toe nail     Social History   Socioeconomic History  . Marital status: Single    Spouse name: Not on file  . Number of children: Not on file  . Years of education: Not on file  . Highest education level: Not on file  Occupational History  . 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 Smoking status: Never Smoker  . Smokeless tobacco: Never Used  Substance and Sexual Activity  . Alcohol use: Not Currently    Alcohol/week: 0.0 standard drinks    Comment: occ  . Drug use: Not Currently    Types: Marijuana  . Sexual activity: Yes  Other Topics Concern  . Not on file  Social History Narrative   No smoking in house   Social Determinants of Health   Financial Resource Strain:   . Difficulty of Paying Living Expenses:    Food Insecurity:   . Worried About Programme researcher, broadcasting/film/video in the Last Year:   . Barista in the Last Year:   Transportation Needs:   . Freight forwarder (Medical):   Marland Kitchen Lack of Transportation (Non-Medical):   Physical Activity:   . Days of Exercise per Week:   . Minutes of Exercise per Session:   Stress:   . Feeling of Stress :   Social Connections:   . Frequency of Communication with Friends and Family:   . Frequency of Social Gatherings with Friends and Family:   . Attends Religious Services:   . Active Member of Clubs or Organizations:   . Attends Banker Meetings:   Marland Kitchen Marital Status:   Intimate Partner Violence:   . Fear of Current or Ex-Partner:   . Emotionally Abused:   Marland Kitchen Physically Abused:   . Sexually Abused:    No current facility-administered medications on file prior to encounter.   Current Outpatient Medications on File Prior to Encounter  Medication Sig Dispense Refill  . promethazine (PHENERGAN) 25 MG tablet Take 25 mg by mouth every 6 (six) hours as needed for nausea or vomiting.    . pyridOXINE (VITAMIN B-6) 100 MG tablet Take 100 mg by mouth daily.    Marland Kitchen triamcinolone cream (KENALOG) 0.1 % Apply  1 application topically 2 (two) times daily. To affected eczema areas 30 g 0   No Known Allergies  I have reviewed patient's Past Medical Hx, Surgical Hx, Family Hx, Social Hx, medications and allergies.   ROS:  Review of Systems  Constitutional: Negative for chills and fever.  Gastrointestinal: Positive for vomiting. Negative for abdominal pain and diarrhea.  Neurological: Negative for dizziness.   Review of Systems  Other systems negative   Physical Exam  Physical Exam Patient Vitals for the past 24 hrs:  BP Temp Pulse Resp SpO2 Weight  08/07/19 2010 125/66 98.4 F (36.9 C) 78 17 99 % 71.7 kg   Constitutional: Well-developed female in no acute distress.  Cardiovascular: normal rate Respiratory: normal effort GI: Abd soft,  non-tender. Pos BS x 4 MS: Extremities nontender, no edema, normal ROM Neurologic: Alert and oriented x 4.  GU: Neg CVAT.  PELVIC EXAM: deferred  LAB RESULTS Results for orders placed or performed during the hospital encounter of 08/07/19 (from the past 24 hour(s))  Urinalysis, Routine w reflex microscopic     Status: Abnormal   Collection Time: 08/07/19  8:11 PM  Result Value Ref Range   Color, Urine AMBER (A) YELLOW   APPearance HAZY (A) CLEAR   Specific Gravity, Urine 1.036 (H) 1.005 - 1.030   pH 6.0 5.0 - 8.0   Glucose, UA NEGATIVE NEGATIVE mg/dL   Hgb urine dipstick SMALL (A) NEGATIVE   Bilirubin Urine NEGATIVE NEGATIVE   Ketones, ur 80 (A) NEGATIVE mg/dL   Protein, ur 100 (A) NEGATIVE mg/dL   Nitrite NEGATIVE NEGATIVE   Leukocytes,Ua NEGATIVE NEGATIVE   RBC / HPF 0-5 0 - 5 RBC/hpf   WBC, UA 0-5 0 - 5 WBC/hpf   Bacteria, UA MANY (A) NONE SEEN   Squamous Epithelial / LPF 6-10 0 - 5   Mucus PRESENT   Pregnancy, urine POC     Status: Abnormal   Collection Time: 08/07/19  8:17 PM  Result Value Ref Range   Preg Test, Ur POSITIVE (A) NEGATIVE       IMAGING No results found.  MAU Management/MDM: Ordered IV hydration for her.  We gave her two liters of fluid We gave her Phenergan for nausea and added decadron She was later able to tolerate po intake without any vomiting.   ASSESSMENT Pregnancy at [redacted]w[redacted]d Nausea and vomiting of pregnancy dehydration  PLAN Discharge home Rx Protonix for acid reduction Rx Reglan 5-10mg  prn nausea May use these in addition or instead of meds she has at home (Phenergan, B6)  Pt stable at time of discharge. Encouraged to return here or to other Urgent Care/ED if she develops worsening of symptoms, increase in pain, fever, or other concerning symptoms.    Hansel Feinstein CNM, MSN Certified Nurse-Midwife 08/07/2019  8:33 PM

## 2019-08-08 DIAGNOSIS — O219 Vomiting of pregnancy, unspecified: Secondary | ICD-10-CM

## 2019-08-08 DIAGNOSIS — Z3A01 Less than 8 weeks gestation of pregnancy: Secondary | ICD-10-CM | POA: Diagnosis not present

## 2019-08-08 MED ORDER — PANTOPRAZOLE SODIUM 20 MG PO TBEC: 20.0000 mg | DELAYED_RELEASE_TABLET | Freq: Every day | ORAL | 11 refills | Status: DC

## 2019-08-08 MED ORDER — METOCLOPRAMIDE HCL 10 MG PO TABS: 5.0000 mg | ORAL_TABLET | Freq: Four times a day (QID) | ORAL | 0 refills | Status: DC | PRN

## 2019-08-08 NOTE — Discharge Instructions (Signed)

## 2019-08-12 ENCOUNTER — Other Ambulatory Visit: Payer: Self-pay

## 2019-08-12 ENCOUNTER — Inpatient Hospital Stay (HOSPITAL_COMMUNITY): Payer: BC Managed Care – PPO

## 2019-08-12 ENCOUNTER — Encounter (HOSPITAL_COMMUNITY): Payer: Self-pay | Admitting: Obstetrics and Gynecology

## 2019-08-12 ENCOUNTER — Inpatient Hospital Stay (HOSPITAL_COMMUNITY)
Admission: AD | Admit: 2019-08-12 | Discharge: 2019-08-12 | Disposition: A | Discharge status: Home / Self Care | Payer: BC Managed Care – PPO | Attending: Obstetrics and Gynecology | Admitting: Obstetrics and Gynecology

## 2019-08-12 DIAGNOSIS — O208 Other hemorrhage in early pregnancy: Secondary | ICD-10-CM | POA: Insufficient documentation

## 2019-08-12 DIAGNOSIS — Z3A01 Less than 8 weeks gestation of pregnancy: Secondary | ICD-10-CM | POA: Diagnosis not present

## 2019-08-12 DIAGNOSIS — O210 Mild hyperemesis gravidarum: Secondary | ICD-10-CM | POA: Diagnosis not present

## 2019-08-12 DIAGNOSIS — E0590 Thyrotoxicosis, unspecified without thyrotoxic crisis or storm: Secondary | ICD-10-CM | POA: Diagnosis not present

## 2019-08-12 DIAGNOSIS — O99281 Endocrine, nutritional and metabolic diseases complicating pregnancy, first trimester: Secondary | ICD-10-CM | POA: Diagnosis not present

## 2019-08-12 LAB — COMPREHENSIVE METABOLIC PANEL
ALT: 13 U/L (ref 0–44)
AST: 15 U/L (ref 15–41)
Albumin: 3.9 g/dL (ref 3.5–5.0)
Alkaline Phosphatase: 29 U/L — ABNORMAL LOW (ref 38–126)
Anion gap: 9 (ref 5–15)
BUN: 11 mg/dL (ref 6–20)
CO2: 25 mmol/L (ref 22–32)
Calcium: 9.4 mg/dL (ref 8.9–10.3)
Chloride: 101 mmol/L (ref 98–111)
Creatinine, Ser: 0.83 mg/dL (ref 0.44–1.00)
GFR calc Af Amer: >60 mL/min (ref >60)
GFR calc non Af Amer: >60 mL/min (ref >60)
Glucose, Bld: 78 mg/dL (ref 70–99)
Potassium: 3.8 mmol/L (ref 3.5–5.1)
Sodium: 135 mmol/L (ref 135–145)
Total Bilirubin: 2.1 mg/dL — ABNORMAL HIGH (ref 0.3–1.2)
Total Protein: 7 g/dL (ref 6.5–8.1)

## 2019-08-12 LAB — URINALYSIS, ROUTINE W REFLEX MICROSCOPIC
Bilirubin Urine: NEGATIVE
Glucose, UA: 50 mg/dL — AB
Hgb urine dipstick: NEGATIVE
Ketones, ur: 80 mg/dL — AB
Leukocytes,Ua: NEGATIVE
Nitrite: NEGATIVE
Protein, ur: 100 mg/dL — AB
Specific Gravity, Urine: 1.036 — ABNORMAL HIGH (ref 1.005–1.030)
pH: 5 (ref 5.0–8.0)

## 2019-08-12 LAB — T4, FREE: Free T4: 1.49 ng/dL — ABNORMAL HIGH (ref 0.61–1.12)

## 2019-08-12 LAB — TSH: TSH: 0.049 u[IU]/mL — ABNORMAL LOW (ref 0.350–4.500)

## 2019-08-12 MED ORDER — LACTATED RINGERS IV SOLN
Freq: Once | INTRAVENOUS | Status: AC
  Filled 2019-08-12: qty 10

## 2019-08-12 MED ORDER — SODIUM CHLORIDE 0.9 % IV SOLN
8.0000 mg | INTRAVENOUS | Status: AC
  Administered 2019-08-12: 8 mg via INTRAVENOUS
  Filled 2019-08-12: qty 4

## 2019-08-12 MED ORDER — ONDANSETRON HCL 4 MG PO TABS: 4.0000 mg | ORAL_TABLET | Freq: Three times a day (TID) | ORAL | 6 refills | Status: DC | PRN

## 2019-08-12 MED ORDER — LACTATED RINGERS IV BOLUS
2000.0000 mL | Freq: Once | INTRAVENOUS | Status: AC
  Administered 2019-08-12 (×2): 1000 mL via INTRAVENOUS

## 2019-08-12 NOTE — Discharge Instructions (Signed)
Hyperemesis Gravidarum Hyperemesis gravidarum is a severe form of nausea and vomiting that happens during pregnancy. Hyperemesis is worse than morning sickness. It may cause you to have nausea or vomiting all day for many days. It may keep you from eating and drinking enough food and liquids, which can lead to dehydration, malnutrition, and weight loss. Hyperemesis usually occurs during the first half (the first 20 weeks) of pregnancy. It often goes away once a woman is in her second half of pregnancy. However, sometimes hyperemesis continues through an entire pregnancy. What are the causes? The cause of this condition is not known. It may be related to changes in chemicals (hormones) in the body during pregnancy, such as the high level of pregnancy hormone (human chorionic gonadotropin) or the increase in the female sex hormone (estrogen). What are the signs or symptoms? Symptoms of this condition include:  Nausea that does not go away.  Vomiting that does not allow you to keep any food down.  Weight loss.  Body fluid loss (dehydration).  Having no desire to eat, or not liking food that you have previously enjoyed. How is this diagnosed? This condition may be diagnosed based on:  A physical exam.  Your medical history.  Your symptoms.  Blood tests.  Urine tests. How is this treated? This condition is managed by controlling symptoms. This may include:  Following an eating plan. This can help lessen nausea and vomiting.  Taking prescription medicines. An eating plan and medicines are often used together to help control symptoms. If medicines do not help relieve nausea and vomiting, you may need to receive fluids through an IV at the hospital. Follow these instructions at home: Eating and drinking   Avoid the following: ? Drinking fluids with meals. Try not to drink anything during the 30 minutes before and after your meals. ? Drinking more than 1 cup of fluid at a  time. ? Eating foods that trigger your symptoms. These may include spicy foods, coffee, high-fat foods, very sweet foods, and acidic foods. ? Skipping meals. Nausea can be more intense on an empty stomach. If you cannot tolerate food, do not force it. Try sucking on ice chips or other frozen items and make up for missed calories later. ? Lying down within 2 hours after eating. ? Being exposed to environmental triggers. These may include food smells, smoky rooms, closed spaces, rooms with strong smells, warm or humid places, overly loud and noisy rooms, and rooms with motion or flickering lights. Try eating meals in a well-ventilated area that is free of strong smells. ? Quick and sudden changes in your movement. ? Taking iron pills and multivitamins that contain iron. If you take prescription iron pills, do not stop taking them unless your health care provider approves. ? Preparing food. The smell of food can spoil your appetite or trigger nausea.  To help relieve your symptoms: ? Listen to your body. Everyone is different and has different preferences. Find what works best for you. ? Eat and drink slowly. ? Eat 5-6 small meals daily instead of 3 large meals. Eating small meals and snacks can help you avoid an empty stomach. ? In the morning, before getting out of bed, eat a couple of crackers to avoid moving around on an empty stomach. ? Try eating starchy foods as these are usually tolerated well. Examples include cereal, toast, bread, potatoes, pasta, rice, and pretzels. ? Include at least 1 serving of protein with your meals and snacks. Protein options include   lean meats, poultry, seafood, beans, nuts, nut butters, eggs, cheese, and yogurt. ? Try eating a protein-rich snack before bed. Examples of a protein-rick snack include cheese and crackers or a peanut butter sandwich made with 1 slice of whole-wheat bread and 1 tsp (5 g) of peanut butter. ? Eat or suck on things that have ginger in them.  It may help relieve nausea. Add  tsp ground ginger to hot tea or choose ginger tea. ? Try drinking 100% fruit juice or an electrolyte drink. An electrolyte drink contains sodium, potassium, and chloride. ? Drink fluids that are cold, clear, and carbonated or sour. Examples include lemonade, ginger ale, lemon-lime soda, ice water, and sparkling water. ? Brush your teeth or use a mouth rinse after meals. ? Talk with your health care provider about starting a supplement of vitamin B6. General instructions  Take over-the-counter and prescription medicines only as told by your health care provider.  Follow instructions from your health care provider about eating or drinking restrictions.  Continue to take your prenatal vitamins as told by your health care provider. If you are having trouble taking your prenatal vitamins, talk with your health care provider about different options.  Keep all follow-up and pre-birth (prenatal) visits as told by your health care provider. This is important. Contact a health care provider if:  You have pain in your abdomen.  You have a severe headache.  You have vision problems.  You are losing weight.  You feel weak or dizzy. Get help right away if:  You cannot drink fluids without vomiting.  You vomit blood.  You have constant nausea and vomiting.  You are very weak.  You faint.  You have a fever and your symptoms suddenly get worse. Summary  Hyperemesis gravidarum is a severe form of nausea and vomiting that happens during pregnancy.  Making some changes to your eating habits may help relieve nausea and vomiting.  This condition may be managed with medicine.  If medicines do not help relieve nausea and vomiting, you may need to receive fluids through an IV at the hospital. This information is not intended to replace advice given to you by your health care provider. Make sure you discuss any questions you have with your health care  provider. Document Revised: 05/28/2017 Document Reviewed: 01/05/2016 Elsevier Patient Education  2020 [employer]  

## 2019-08-12 NOTE — MAU Note (Signed)
Pt is complaining of nausea and vomiting for about a week. She was at MAU on 3/19 and was given Reglan and Protonix which worked for a while, but now it is not helping. Did not take today. Has vomited 4x in 24 hours.

## 2019-08-12 NOTE — MAU Provider Note (Signed)
History     Chief Complaint  Patient presents with   Nausea   Emesis   19 yo G1P0 BF @ 6 1/[redacted] wk gestation presents for the 2nd time to MAU with c/o inability to keep food down despite Meds given on last visit here. (+) n/v. Denies diarrhea  OB History     Gravida  1   Para      Term      Preterm      AB      Living         SAB      TAB      Ectopic      Multiple      Live Births              Past Medical History:  Diagnosis Date   Asthma    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)     Past Surgical History:  Procedure Laterality Date   toe nail      Family History  Problem Relation Age of Onset   Hypertension Father    Hypertension Mother    Diabetes Mother        borderline    Social History   Tobacco Use   Smoking status: Never Smoker   Smokeless tobacco: Never Used  Substance Use Topics   Alcohol use: Not Currently    Alcohol/week: 0.0 standard drinks    Comment: occ   Drug use: Not Currently    Types: Marijuana    Allergies: No Known Allergies  Medications Prior to Admission  Medication Sig Dispense Refill Last Dose   metoCLOPramide (REGLAN) 10 MG tablet Take 0.5-1 tablets (5-10 mg total) by mouth every 6 (six) hours as needed for nausea. 30 tablet 0    pantoprazole (PROTONIX) 20 MG tablet Take 1 tablet (20 mg total) by mouth daily. 30 tablet 11    promethazine (PHENERGAN) 25 MG tablet Take 25 mg by mouth every 6 (six) hours as needed for nausea or vomiting.      pyridOXINE (VITAMIN B-6) 100 MG tablet Take 100 mg by mouth daily.      triamcinolone cream (KENALOG) 0.1 % Apply 1 application topically 2 (two) times daily. To affected eczema areas 30 g 0      Physical Exam   Blood pressure (!) 118/59, pulse 67, temperature 99.3 F (37.4 C), temperature source Oral, resp. rate 16, height 5\' 5"  (1.651 m), weight 69.3 kg, last menstrual period 06/29/2019, SpO2 99 %.  General appearance: alert, cooperative, and no distress Lungs: clear to auscultation  bilaterally Heart: regular rate and rhythm, S1, S2 normal, no murmur, click, rub or gallop Abdomen: soft, non-tender; bowel sounds normal; no masses,  no organomegaly Pelvic: deferred Extremities: no edema, redness or tenderness in the calves or thighs Skin: Skin color, texture, turgor normal. No rashes or lesions ED Course  HEG IUP @ 6 1/2 wk P) IVF x 2L. Zofran IV. Sonogram check viability r/o molar preg or multiple gestation. TFT r/o hyperthyroidism . CMET MDM Addendum. Feels better after fluid 08/27/2019 OB LESS THAN 14 WEEKS WITH OB TRANSVAGINAL  Result Date: 08/12/2019 CLINICAL DATA:  Hyperemesis gravidarum EXAM: OBSTETRIC <14 WK 08/14/2019 AND TRANSVAGINAL OB US TECHNIQUE: Both transabdominal and transvaginal ultrasound examinations were performed for complete evaluation of the gestation as well as the maternal uterus, adnexal regions, and pelvic cul-de-sac. Transvaginal technique was performed to assess early pregnancy. COMPARISON:  None FINDINGS: Intrauterine gestational sac: Present, single Yolk sac:  Present Embryo:  Present  Cardiac Activity: Present Heart Rate: 107 bpm CRL:  6.2 mm   6 w   3 d                  [place] EDC: 04/03/2020 Subchorionic hemorrhage:  Probable small subchronic hemorrhage Maternal uterus/adnexae: Maternal uterus otherwise normal appearance. RIGHT ovary normal size and morphology 3.1 x 2.7 x 3.1 cm. LEFT ovary normal size and morphology 2.7 x 2.7 x 2.2 cm. Trace free pelvic fluid. No adnexal masses. IMPRESSION: Single live intrauterine gestation at 6 weeks 3 days EGA. Probable small subchronic hemorrhage. Electronically Signed   By: Lavonia Dana M.D.   On: 08/12/2019 16:31    TSH 0.049. elev free T4 IMP: hyperthyroidism in first trim HEG IUP @ 6 3/7 week P) d/c home. Cont zofran. Endocrine consult for thyroid mgmt BRAT diet  IM Marvene Staff, MD 1:51 PM 08/12/2019

## 2019-08-13 LAB — T3: T3, Total: 111 ng/dL (ref 71–180)

## 2019-08-21 DIAGNOSIS — E0590 Thyrotoxicosis, unspecified without thyrotoxic crisis or storm: Secondary | ICD-10-CM | POA: Diagnosis not present

## 2019-08-21 DIAGNOSIS — Z331 Pregnant state, incidental: Secondary | ICD-10-CM | POA: Diagnosis not present

## 2019-08-25 DIAGNOSIS — Z3200 Encounter for pregnancy test, result unknown: Secondary | ICD-10-CM | POA: Diagnosis not present

## 2019-08-26 ENCOUNTER — Encounter (HOSPITAL_COMMUNITY): Payer: Self-pay | Admitting: Obstetrics and Gynecology

## 2019-08-26 ENCOUNTER — Inpatient Hospital Stay (HOSPITAL_COMMUNITY)
Admission: AD | Admit: 2019-08-26 | Discharge: 2019-08-26 | Disposition: A | Discharge status: Home / Self Care | Payer: BC Managed Care – PPO | Attending: Obstetrics and Gynecology | Admitting: Obstetrics and Gynecology

## 2019-08-26 ENCOUNTER — Other Ambulatory Visit: Payer: Self-pay

## 2019-08-26 DIAGNOSIS — O210 Mild hyperemesis gravidarum: Secondary | ICD-10-CM | POA: Diagnosis not present

## 2019-08-26 DIAGNOSIS — Z3A08 8 weeks gestation of pregnancy: Secondary | ICD-10-CM | POA: Insufficient documentation

## 2019-08-26 DIAGNOSIS — O99281 Endocrine, nutritional and metabolic diseases complicating pregnancy, first trimester: Secondary | ICD-10-CM | POA: Diagnosis not present

## 2019-08-26 DIAGNOSIS — O219 Vomiting of pregnancy, unspecified: Secondary | ICD-10-CM

## 2019-08-26 LAB — URINALYSIS, ROUTINE W REFLEX MICROSCOPIC
Bilirubin Urine: NEGATIVE
Glucose, UA: 50 mg/dL — AB
Hgb urine dipstick: NEGATIVE
Ketones, ur: 80 mg/dL — AB
Nitrite: NEGATIVE
Protein, ur: 30 mg/dL — AB
Specific Gravity, Urine: 1.028 (ref 1.005–1.030)
pH: 5 (ref 5.0–8.0)

## 2019-08-26 MED ORDER — SCOPOLAMINE 1 MG/3DAYS TD PT72: 1.0000 | MEDICATED_PATCH | TRANSDERMAL | 12 refills | Status: DC

## 2019-08-26 MED ORDER — SCOPOLAMINE 1 MG/3DAYS TD PT72
1.0000 | MEDICATED_PATCH | TRANSDERMAL | Status: DC
  Administered 2019-08-26: 20:00:00 1.5 mg via TRANSDERMAL
  Filled 2019-08-26: qty 1

## 2019-08-26 NOTE — MAU Note (Signed)
Been vomiting, can't get anything down.  Is very nauseated.  Is cold/has chills. No pain, no diarrhea or constipation.  Taking zofran hasn't taken since yesterday

## 2019-08-26 NOTE — MAU Provider Note (Signed)
History     Chief Complaint  Patient presents with  . Emesis  . Nausea  19 yo G1P0 SF @ 8 2/[redacted] wk gestation with N/V presents with c/o not keeping anything down And is nauseated. Pt has not taken any of the meds she has( reglan, zofran or phenergan). Pt is seeing endocrine for eval for possible hyperthyroidism. Pt was just seen in the office with no complaint  OB History     Gravida  1   Para      Term      Preterm      AB      Living         SAB      TAB      Ectopic      Multiple      Live Births              Past Medical History:  Diagnosis Date  . Asthma   . 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)     Past Surgical History:  Procedure Laterality Date  . toe nail      Family History  Problem Relation Age of Onset  . Hypertension Father   . Hypertension Mother   . Diabetes Mother        borderline    Social History   Tobacco Use  . Smoking status: Never Smoker  . Smokeless tobacco: Never Used  Substance Use Topics  . Alcohol use: Not Currently    Alcohol/week: 0.0 standard drinks    Comment: occ  . Drug use: Not Currently    Types: Marijuana    Comment: March 2021    Allergies: No Known Allergies  Medications Prior to Admission  Medication Sig Dispense Refill Last Dose  . ondansetron (ZOFRAN) 4 MG tablet Take 1 tablet (4 mg total) by mouth every 8 (eight) hours as needed for nausea or vomiting. 24 tablet 6 08/25/2019 at 1300  . triamcinolone cream (KENALOG) 0.1 % Apply 1 application topically 2 (two) times daily. To affected eczema areas 30 g 0 Past Month at Unknown time  . metoCLOPramide (REGLAN) 10 MG tablet Take 0.5-1 tablets (5-10 mg total) by mouth every 6 (six) hours as needed for nausea. 30 tablet 0   . pantoprazole (PROTONIX) 20 MG tablet Take 1 tablet (20 mg total) by mouth daily. 30 tablet 11   . promethazine (PHENERGAN) 25 MG tablet Take 25 mg by mouth every 6 (six) hours as needed for nausea or vomiting.     . pyridOXINE (VITAMIN B-6) 100 MG tablet  Take 100 mg by mouth daily.        Physical Exam   Blood pressure 126/84, pulse 81, temperature 98.2 F (36.8 C), temperature source Axillary, resp. rate 16, height 5\' 5"  (1.651 m), weight 69.4 kg, last menstrual period 06/29/2019, SpO2 99 %.  No exam performed today,  not indicated for the complaint . ED Course  N/V in pregnancy IUP@ 8 2/7 wk P) offered scopolamine patch. Review need to take the meds that have already prescribed as well as oral hydration Keep OB appt MDM   4/7, MD 7:30 PM 08/26/2019

## 2019-08-28 ENCOUNTER — Other Ambulatory Visit: Payer: Self-pay

## 2019-08-28 ENCOUNTER — Inpatient Hospital Stay (HOSPITAL_COMMUNITY)
Admission: AD | Admit: 2019-08-28 | Discharge: 2019-08-28 | Disposition: A | Discharge status: Home / Self Care | Payer: BC Managed Care – PPO | Attending: Obstetrics and Gynecology | Admitting: Obstetrics and Gynecology

## 2019-08-28 ENCOUNTER — Encounter (HOSPITAL_COMMUNITY): Payer: Self-pay | Admitting: Obstetrics and Gynecology

## 2019-08-28 DIAGNOSIS — Z3A08 8 weeks gestation of pregnancy: Secondary | ICD-10-CM | POA: Diagnosis not present

## 2019-08-28 DIAGNOSIS — O210 Mild hyperemesis gravidarum: Secondary | ICD-10-CM | POA: Diagnosis not present

## 2019-08-28 DIAGNOSIS — O99281 Endocrine, nutritional and metabolic diseases complicating pregnancy, first trimester: Secondary | ICD-10-CM | POA: Diagnosis not present

## 2019-08-28 DIAGNOSIS — O219 Vomiting of pregnancy, unspecified: Secondary | ICD-10-CM | POA: Diagnosis not present

## 2019-08-28 LAB — URINALYSIS, ROUTINE W REFLEX MICROSCOPIC
Bilirubin Urine: NEGATIVE
Glucose, UA: 50 mg/dL — AB
Ketones, ur: 80 mg/dL — AB
Leukocytes,Ua: NEGATIVE
Nitrite: NEGATIVE
Protein, ur: 100 mg/dL — AB
Specific Gravity, Urine: 1.032 — ABNORMAL HIGH (ref 1.005–1.030)
pH: 5 (ref 5.0–8.0)

## 2019-08-28 MED ORDER — PROMETHAZINE HCL 25 MG RE SUPP
25.0000 mg | Freq: Once | RECTAL | Status: AC
  Administered 2019-08-28: 25 mg via RECTAL
  Filled 2019-08-28: qty 1

## 2019-08-28 MED ORDER — ONDANSETRON 8 MG PO TBDP: 8.0000 mg | ORAL_TABLET | Freq: Two times a day (BID) | ORAL | 6 refills | Status: DC

## 2019-08-28 MED ORDER — SODIUM CHLORIDE 0.9 % IV SOLN
8.0000 mg | Freq: Once | INTRAVENOUS | Status: AC
  Administered 2019-08-28: 8 mg via INTRAVENOUS
  Filled 2019-08-28: qty 4

## 2019-08-28 MED ORDER — LACTATED RINGERS IV BOLUS
1000.0000 mL | INTRAVENOUS | Status: AC
  Administered 2019-08-28 (×2): 1000 mL via INTRAVENOUS

## 2019-08-28 MED ORDER — PROMETHAZINE HCL 25 MG RE SUPP: 25.0000 mg | Freq: Four times a day (QID) | RECTAL | 8 refills | Status: DC | PRN

## 2019-08-28 NOTE — MAU Note (Signed)
Kristen Cain is a 19 y.o. at [redacted]w[redacted]d here in MAU reporting: nonstop vomiting since last in MAU on Tuesday. No pain. No bleeding or discharge. LMP:  Pain score: 0    Lab orders placed from triage: UA

## 2019-08-28 NOTE — Discharge Instructions (Signed)
Nausea and Vomiting, Adult Nausea is the feeling that you have an upset stomach or that you are about to vomit. Vomiting is when stomach contents are thrown up and out of the mouth as a result of nausea. Vomiting can make you feel weak and cause you to become dehydrated. Dehydration can make you feel tired and thirsty, cause you to have a dry mouth, and decrease how often you urinate. Older adults and people with other diseases or a weak disease-fighting system (immune system) are at higher risk for dehydration. It is important to treat your nausea and vomiting as told by your health care provider. Follow these instructions at home: Watch your symptoms for any changes. Tell your health care provider about them. Follow these instructions to care for yourself at home. Eating and drinking      Take an oral rehydration solution (ORS). This is a drink that is sold at pharmacies and retail stores.  Drink clear fluids slowly and in small amounts as you are able. Clear fluids include water, ice chips, low-calorie sports drinks, and fruit juice that has water added (diluted fruit juice).  Eat bland, easy-to-digest foods in small amounts as you are able. These foods include bananas, applesauce, rice, lean meats, toast, and crackers.  Avoid fluids that contain a lot of sugar or caffeine, such as energy drinks, sports drinks, and soda.  Avoid alcohol.  Avoid spicy or fatty foods. General instructions  Take over-the-counter and prescription medicines only as told by your health care provider.  Drink enough fluid to keep your urine pale yellow.  Wash your hands often using soap and water. If soap and water are not available, use hand sanitizer.  Make sure that all people in your household wash their hands well and often.  Rest at home while you recover.  Watch your condition for any changes.  Breathe slowly and deeply when you feel nauseated.  Keep all follow-up visits as told by your health  care provider. This is important. Contact a health care provider if:  Your symptoms get worse.  You have new symptoms.  You have a fever.  You cannot drink fluids without vomiting.  Your nausea does not go away after 2 days.  You feel light-headed or dizzy.  You have a headache.  You have muscle cramps.  You have a rash.  You have pain while urinating. Get help right away if:  You have pain in your chest, neck, arm, or jaw.  You feel extremely weak or you faint.  You have persistent vomiting.  You have vomit that is bright red or looks like black coffee grounds.  You have bloody or black stools or stools that look like tar.  You have a severe headache, a stiff neck, or both.  You have severe pain, cramping, or bloating in your abdomen.  You have difficulty breathing, or you are breathing very quickly.  Your heart is beating very quickly.  Your skin feels cold and clammy.  You feel confused.  You have signs of dehydration, such as: ? Dark urine, very little urine, or no urine. ? Cracked lips. ? Dry mouth. ? Sunken eyes. ? Sleepiness. ? Weakness. These symptoms may represent a serious problem that is an emergency. Do not wait to see if the symptoms will go away. Get medical help right away. Call your local emergency services (911 in the [place] Do not drive yourself to the hospital. Summary  Nausea is the feeling that you have an upset stomach   or that you are about to vomit. As nausea gets worse, it can lead to vomiting. Vomiting can make you feel weak and cause you to become dehydrated.  Follow instructions from your health care provider about eating and drinking to prevent dehydration.  Take over-the-counter and prescription medicines only as told by your health care provider.  Contact your health care provider if your symptoms get worse, or you have new symptoms.  Keep all follow-up visits as told by your health care provider. This is important. This  information is not intended to replace advice given to you by your health care provider. Make sure you discuss any questions you have with your health care provider. Document Revised: 08/30/2018 Document Reviewed: 10/16/2017 Elsevier Patient Education  2020 Elsevier Inc.  

## 2019-08-28 NOTE — MAU Provider Note (Signed)
  S: 19 yo G2P0 BF @ 8+ wk gestation here with c/o inability to keep anything down. Pt was seen two days ago where she had scopolamine Patch added to her regimen. She has been seeing endocrine to r/o  Hyperthyroidism. Pt has not been taking any of her antiemetic meds  For past 24 hrs despite instructions otherwise on her last visit here  BP 117/74 (BP Location: Left Arm)   Pulse 94   Temp 99.3 F (37.4 C) (Oral)   Resp 16   Wt 66.6 kg   LMP 06/29/2019   SpO2 99%   BMI 24.43 kg/m   O: General: well developed female in no acute distress           ED Course  N/V in pregnancy IUP @ 8 + wk P) IVF x 2 liters. Zofran IV. Cont with current regimen on medications. D/c home thereafter. Resume the prescribed meds( change to zofran ODT , reglan). Pt declines changing to rectal phenergan suppository. Disc need to still frequent small intake of  food as well as drinking. Need to take med on a regular basis MDM   Serita Kyle, MD

## 2019-09-01 DIAGNOSIS — Z789 Other specified health status: Secondary | ICD-10-CM | POA: Diagnosis not present

## 2019-09-01 DIAGNOSIS — Z3401 Encounter for supervision of normal first pregnancy, first trimester: Secondary | ICD-10-CM | POA: Diagnosis not present

## 2019-09-01 DIAGNOSIS — Z3689 Encounter for other specified antenatal screening: Secondary | ICD-10-CM | POA: Diagnosis not present

## 2019-09-01 LAB — OB RESULTS CONSOLE RUBELLA ANTIBODY, IGM: Rubella: IMMUNE

## 2019-09-01 LAB — OB RESULTS CONSOLE HEPATITIS B SURFACE ANTIGEN: Hepatitis B Surface Ag: NEGATIVE

## 2019-09-01 LAB — OB RESULTS CONSOLE HIV ANTIBODY (ROUTINE TESTING): HIV: NONREACTIVE

## 2019-09-08 ENCOUNTER — Inpatient Hospital Stay (HOSPITAL_COMMUNITY)
Admission: AD | Admit: 2019-09-08 | Discharge: 2019-09-08 | Disposition: A | Discharge status: Home / Self Care | Payer: BC Managed Care – PPO | Attending: Obstetrics and Gynecology | Admitting: Obstetrics and Gynecology

## 2019-09-08 ENCOUNTER — Encounter (HOSPITAL_COMMUNITY): Payer: Self-pay | Admitting: Obstetrics and Gynecology

## 2019-09-08 ENCOUNTER — Other Ambulatory Visit: Payer: Self-pay

## 2019-09-08 DIAGNOSIS — O210 Mild hyperemesis gravidarum: Secondary | ICD-10-CM | POA: Insufficient documentation

## 2019-09-08 DIAGNOSIS — O99280 Endocrine, nutritional and metabolic diseases complicating pregnancy, unspecified trimester: Secondary | ICD-10-CM | POA: Diagnosis present

## 2019-09-08 DIAGNOSIS — E860 Dehydration: Secondary | ICD-10-CM | POA: Diagnosis not present

## 2019-09-08 DIAGNOSIS — O26891 Other specified pregnancy related conditions, first trimester: Secondary | ICD-10-CM | POA: Diagnosis not present

## 2019-09-08 DIAGNOSIS — Z3A10 10 weeks gestation of pregnancy: Secondary | ICD-10-CM | POA: Insufficient documentation

## 2019-09-08 DIAGNOSIS — R000 Tachycardia, unspecified: Secondary | ICD-10-CM | POA: Diagnosis present

## 2019-09-08 LAB — URINALYSIS, ROUTINE W REFLEX MICROSCOPIC
Bilirubin Urine: NEGATIVE
Glucose, UA: 50 mg/dL — AB
Hgb urine dipstick: NEGATIVE
Ketones, ur: 80 mg/dL — AB
Leukocytes,Ua: NEGATIVE
Nitrite: NEGATIVE
Protein, ur: 100 mg/dL — AB
Specific Gravity, Urine: 1.031 — ABNORMAL HIGH (ref 1.005–1.030)
Squamous Epithelial / HPF: >50 — ABNORMAL HIGH (ref 0–5)
pH: 5 (ref 5.0–8.0)

## 2019-09-08 MED ORDER — FAMOTIDINE IN NACL 20-0.9 MG/50ML-% IV SOLN
20.0000 mg | Freq: Once | INTRAVENOUS | Status: AC
  Administered 2019-09-08: 20 mg via INTRAVENOUS
  Filled 2019-09-08: qty 50

## 2019-09-08 MED ORDER — M.V.I. ADULT IV INJ
Freq: Once | INTRAVENOUS | Status: AC
  Filled 2019-09-08: qty 1000

## 2019-09-08 MED ORDER — PROMETHAZINE HCL 25 MG/ML IJ SOLN
25.0000 mg | Freq: Once | INTRAVENOUS | Status: AC
  Administered 2019-09-08: 25 mg via INTRAVENOUS
  Filled 2019-09-08: qty 1

## 2019-09-08 MED ORDER — ONDANSETRON HCL 4 MG/2ML IJ SOLN
4.0000 mg | Freq: Once | INTRAMUSCULAR | Status: AC
  Administered 2019-09-08: 4 mg via INTRAVENOUS
  Filled 2019-09-08: qty 2

## 2019-09-08 NOTE — MAU Provider Note (Signed)
History     CSN: [id number]  Arrival date and time: 09/08/19 1309   First Provider Initiated Contact with Patient 09/08/19 1358      Chief Complaint  Patient presents with  . Emesis  . Fatigue   Ms.  Kristen Cain is a 19 y.o. year old G1P0 female at [redacted]w[redacted]d weeks gestation who presents to MAU reporting that she is "extrememly tired, out of breath and her heart racing." She reports that she can't keep anything down and she vomits about 10-12 times/day. She last vomited "right before getting here." She denies any nausea in between vomiting episodes. She has been Rx'd Zofran. She last took Zofran 2 days ago and has not tried to take any since then, "because she is having trouble keeping it down." She last ate something at 0900 today>>vomited it up. She last took sips at 1200>>vomited it up. She receives [facility] with Dr. Cherly Hensen at [facility]. Her next appointment is on 09/15/2019.    OB History    Gravida  1   Para      Term      Preterm      AB      Living        SAB      TAB      Ectopic      Multiple      Live Births              Past Medical History:  Diagnosis Date  . Asthma   . 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)     Past Surgical History:  Procedure Laterality Date  . toe nail      Family History  Problem Relation Age of Onset  . Hypertension Father   . Hypertension Mother   . Diabetes Mother        borderline    Social History   Tobacco Use  . Smoking status: Never Smoker  . Smokeless tobacco: Never Used  Substance Use Topics  . Alcohol use: Not Currently    Alcohol/week: 0.0 standard drinks    Comment: occ  . Drug use: Not Currently    Types: Marijuana    Comment: March 2021    Allergies: No Known Allergies  Medications Prior to Admission  Medication Sig Dispense Refill Last Dose  . metoCLOPramide (REGLAN) 10 MG tablet Take 0.5-1 tablets (5-10 mg total) by mouth every 6 (six) hours as needed for nausea. 30 tablet 0   . ondansetron (ZOFRAN ODT) 8  MG disintegrating tablet Take 1 tablet (8 mg total) by mouth 2 (two) times daily. 20 tablet 6 09/06/2019  . pantoprazole (PROTONIX) 20 MG tablet Take 1 tablet (20 mg total) by mouth daily. 30 tablet 11   . promethazine (PHENERGAN) 25 MG suppository Place 1 suppository (25 mg total) rectally every 6 (six) hours as needed for nausea or vomiting. 30 each 8   . pyridOXINE (VITAMIN B-6) 100 MG tablet Take 100 mg by mouth daily.     Marland Kitchen scopolamine (TRANSDERM-SCOP) 1 MG/3DAYS Place 1 patch (1.5 mg total) onto the skin every 3 (three) days. 10 patch 12     Review of Systems  Constitutional: Positive for appetite change ("unable to keep anything down") and fatigue.  HENT: Negative.   Eyes: Negative.   Respiratory: Positive for shortness of breath.   Cardiovascular: Positive for palpitations.  Gastrointestinal: Positive for vomiting (vomits 10-12x/day).  Endocrine: Negative.   Genitourinary: Negative.   Musculoskeletal: Negative.   Skin: Negative.   Allergic/Immunologic: Negative.  Neurological: Negative.   Hematological: Negative.   Psychiatric/Behavioral: Negative.    Physical Exam   Blood pressure 111/80, pulse (!) 133, temperature 98.2 F (36.8 C), temperature source Oral, resp. rate 16, height 5\' 5"  (1.651 m), weight 63.2 kg, last menstrual period 06/29/2019, SpO2 97 %.  Physical Exam  Nursing note and vitals reviewed. Constitutional: She is oriented to person, place, and time. She appears well-developed and well-nourished.  HENT:  Head: Normocephalic and atraumatic.  Eyes: Pupils are equal, round, and reactive to light.  Cardiovascular: Tachycardia present.  Respiratory: Effort normal.  GI: Soft.  Musculoskeletal:        General: Normal range of motion.     Cervical back: Normal range of motion.  Neurological: She is alert and oriented to person, place, and time.  Skin: Skin is warm and dry.  Psychiatric: She has a normal mood and affect. Her behavior is normal. Judgment and  thought content normal.    MAU Course  Procedures  MDM IVFs: Phenergan 25 mg in LR 1000 ml @ 999 ml/hr; followed by MVI in LR 1000 ml @ 999 ml/hr -- resolved nausea/vomiting Zofran 4 mg IVPB Pepcid 20 mg IVPB PO Challenge -- patient tolerated well   Recent Results (from the past 2160 hour(s))  Urinalysis, Routine w reflex microscopic     Status: Abnormal   Collection Time: 08/07/19  8:11 PM  Result Value Ref Range   Color, Urine AMBER (A) YELLOW    Comment: BIOCHEMICALS MAY BE AFFECTED BY COLOR   APPearance HAZY (A) CLEAR   Specific Gravity, Urine 1.036 (H) 1.005 - 1.030   pH 6.0 5.0 - 8.0   Glucose, UA NEGATIVE NEGATIVE mg/dL   Hgb urine dipstick SMALL (A) NEGATIVE   Bilirubin Urine NEGATIVE NEGATIVE   Ketones, ur 80 (A) NEGATIVE mg/dL   Protein, ur 100 (A) NEGATIVE mg/dL   Nitrite NEGATIVE NEGATIVE   Leukocytes,Ua NEGATIVE NEGATIVE   RBC / HPF 0-5 0 - 5 RBC/hpf   WBC, UA 0-5 0 - 5 WBC/hpf   Bacteria, UA MANY (A) NONE SEEN   Squamous Epithelial / LPF 6-10 0 - 5   Mucus PRESENT     Comment: Performed at [facility], [address] [place], [place] [address]  Pregnancy, urine POC     Status: Abnormal   Collection Time: 08/07/19  8:17 PM  Result Value Ref Range   Preg Test, Ur POSITIVE (A) NEGATIVE    Comment:        THE SENSITIVITY OF THIS METHODOLOGY IS >24 mIU/mL   Urinalysis, Routine w reflex microscopic     Status: Abnormal   Collection Time: 08/12/19 12:25 PM  Result Value Ref Range   Color, Urine AMBER (A) YELLOW    Comment: BIOCHEMICALS MAY BE AFFECTED BY COLOR   APPearance HAZY (A) CLEAR   Specific Gravity, Urine 1.036 (H) 1.005 - 1.030   pH 5.0 5.0 - 8.0   Glucose, UA 50 (A) NEGATIVE mg/dL   Hgb urine dipstick NEGATIVE NEGATIVE   Bilirubin Urine NEGATIVE NEGATIVE   Ketones, ur 80 (A) NEGATIVE mg/dL   Protein, ur 100 (A) NEGATIVE mg/dL   Nitrite NEGATIVE NEGATIVE   Leukocytes,Ua NEGATIVE NEGATIVE   RBC / HPF 0-5 0 - 5 RBC/hpf   WBC, UA 0-5 0 -  5 WBC/hpf   Bacteria, UA FEW (A) NONE SEEN   Squamous Epithelial / LPF 6-10 0 - 5   Mucus PRESENT     Comment: Performed at  [facility], [address] [place] [place], [place] [address]  TSH     Status: Abnormal   Collection Time: 08/12/19  1:51 PM  Result Value Ref Range   TSH 0.049 (L) 0.350 - 4.500 uIU/mL    Comment: Performed by a 3rd Generation assay with a functional sensitivity of <=0.01 uIU/mL. Performed at [facility], [address] [place], [place] [address]   Comprehensive metabolic panel     Status: Abnormal   Collection Time: 08/12/19  1:51 PM  Result Value Ref Range   Sodium 135 135 - 145 mmol/L   Potassium 3.8 3.5 - 5.1 mmol/L   Chloride 101 98 - 111 mmol/L   CO2 25 22 - 32 mmol/L   Glucose, Bld 78 70 - 99 mg/dL    Comment: Glucose reference range applies only to samples taken after fasting for at least 8 hours.   BUN 11 6 - 20 mg/dL   Creatinine, Ser 7.42 0.44 - 1.00 mg/dL   Calcium 9.4 8.9 - 59.5 mg/dL   Total Protein 7.0 6.5 - 8.1 g/dL   Albumin 3.9 3.5 - 5.0 g/dL   AST 15 15 - 41 U/L   ALT 13 0 - 44 U/L   Alkaline Phosphatase 29 (L) 38 - 126 U/L   Total Bilirubin 2.1 (H) 0.3 - 1.2 mg/dL   GFR calc non Af Amer >60 >60 mL/min   GFR calc Af Amer >60 >60 mL/min   Anion gap 9 5 - 15    Comment: Performed at [facility], [address], [place], [place] [address]  T3     Status: None   Collection Time: 08/12/19  1:51 PM  Result Value Ref Range   T3, Total 111 71 - 180 ng/dL    Comment: (NOTE) Performed At: [facility] [address] [place], [place] [id number] Jolene Schimke MD AC:[id number]   T4, free     Status: Abnormal   Collection Time: 08/12/19  1:51 PM  Result Value Ref Range   Free T4 1.49 (H) 0.61 - 1.12 ng/dL    Comment: (NOTE) Biotin ingestion may interfere with free T4 tests. If the results are inconsistent with the TSH level, previous test results, or the clinical presentation, then consider biotin interference. If  needed, order repeat testing after stopping biotin. Performed at [facility], [address] [place], [place] [address]   Urinalysis, Routine w reflex microscopic     Status: Abnormal   Collection Time: 08/26/19  7:15 PM  Result Value Ref Range   Color, Urine YELLOW YELLOW   APPearance CLOUDY (A) CLEAR   Specific Gravity, Urine 1.028 1.005 - 1.030   pH 5.0 5.0 - 8.0   Glucose, UA 50 (A) NEGATIVE mg/dL   Hgb urine dipstick NEGATIVE NEGATIVE   Bilirubin Urine NEGATIVE NEGATIVE   Ketones, ur 80 (A) NEGATIVE mg/dL   Protein, ur 30 (A) NEGATIVE mg/dL   Nitrite NEGATIVE NEGATIVE   Leukocytes,Ua TRACE (A) NEGATIVE   RBC / HPF 0-5 0 - 5 RBC/hpf   WBC, UA 6-10 0 - 5 WBC/hpf   Bacteria, UA MANY (A) NONE SEEN   Squamous Epithelial / LPF 21-50 0 - 5   Mucus PRESENT     Comment: Performed at [facility], [address], [place], [place] [address]  Urinalysis, Routine w reflex microscopic     Status: Abnormal   Collection Time: 08/28/19 12:23 PM  Result Value Ref Range  Color, Urine YELLOW YELLOW   APPearance CLOUDY (A) CLEAR   Specific Gravity, Urine 1.032 (H) 1.005 - 1.030   pH 5.0 5.0 - 8.0   Glucose, UA 50 (A) NEGATIVE mg/dL   Hgb urine dipstick SMALL (A) NEGATIVE   Bilirubin Urine NEGATIVE NEGATIVE   Ketones, ur 80 (A) NEGATIVE mg/dL   Protein, ur 008 (A) NEGATIVE mg/dL   Nitrite NEGATIVE NEGATIVE   Leukocytes,Ua NEGATIVE NEGATIVE   RBC / HPF 0-5 0 - 5 RBC/hpf   WBC, UA 6-10 0 - 5 WBC/hpf   Bacteria, UA RARE (A) NONE SEEN   Squamous Epithelial / LPF 11-20 0 - 5   Mucus PRESENT     Comment: Performed at [facility], [address], [place], [place] [address]  Urinalysis, Routine w reflex microscopic     Status: Abnormal   Collection Time: 09/08/19  1:36 PM  Result Value Ref Range   Color, Urine AMBER (A) YELLOW    Comment: BIOCHEMICALS MAY BE AFFECTED BY COLOR   APPearance CLOUDY (A) CLEAR   Specific Gravity, Urine 1.031 (H) 1.005 - 1.030   pH 5.0 5.0  - 8.0   Glucose, UA 50 (A) NEGATIVE mg/dL   Hgb urine dipstick NEGATIVE NEGATIVE   Bilirubin Urine NEGATIVE NEGATIVE   Ketones, ur 80 (A) NEGATIVE mg/dL   Protein, ur 509 (A) NEGATIVE mg/dL   Nitrite NEGATIVE NEGATIVE   Leukocytes,Ua NEGATIVE NEGATIVE   RBC / HPF 6-10 0 - 5 RBC/hpf   WBC, UA 11-20 0 - 5 WBC/hpf   Bacteria, UA FEW (A) NONE SEEN   Squamous Epithelial / LPF >50 (H) 0 - 5   Mucus PRESENT    Hyaline Casts, UA PRESENT    Non Squamous Epithelial 0-5 (A) NONE SEEN    Comment: Performed at [facility], [address], [place], [place] [address]  Culture, [place] Urine     Status: Abnormal   Collection Time: 09/08/19  1:36 PM   Specimen: OB Clean Catch; Urine  Result Value Ref Range   Specimen Description OB CLEAN CATCH    Special Requests Immunocompromised    Culture (A)     MULTIPLE SPECIES PRESENT, SUGGEST RECOLLECTION NO GROUP B STREP (S.AGALACTIAE) ISOLATED Performed at [facility], [address], [place], [place] [address]    Report Status 09/10/2019 FINAL    Assessment and Plan  Morning sickness - Advised to continue current regimen for N/V as prescribed by Dr. Cherly Hensen - Information provided on morning sickness   Dehydration during pregnancy   - Discharge patient - Keep scheduled appt with [facility] on 09/15/19 - Patient verbalized an understanding of the plan of care and agrees.     Raelyn Mora, MSN, CNM 09/08/2019, 1:58 PM

## 2019-09-08 NOTE — Discharge Instructions (Signed)
PLEASE TAKE THE MEDICATIONS YOU HAVE BEEN PRESCRIBED FOR NAUSEA AND/OR VOMITING AS PRESCRIBED.

## 2019-09-08 NOTE — MAU Note (Signed)
Kristen Cain is a 19 y.o. at [redacted]w[redacted]d here in MAU reporting: states she is extremely tired. States she gets out of breath and her heart is racing. Cannot keep anything down. Emesis x 10-12 in the past 24 hours. No nausea in between vomiting episodes. States unable to keep meds down.  Onset of complaint: ongoing  Pain score: 0/10  Vitals:   09/08/19 1319  BP: 111/80  Pulse: (!) 133  Resp: 16  Temp: 98.2 F (36.8 C)  SpO2: 97%  HR up to 158 while standing  Lab orders placed from triage: UA

## 2019-09-10 LAB — CULTURE, OB URINE

## 2019-09-16 DIAGNOSIS — E0590 Thyrotoxicosis, unspecified without thyrotoxic crisis or storm: Secondary | ICD-10-CM | POA: Diagnosis not present

## 2019-09-18 DIAGNOSIS — Z3A25 25 weeks gestation of pregnancy: Secondary | ICD-10-CM | POA: Diagnosis not present

## 2019-09-18 DIAGNOSIS — Z113 Encounter for screening for infections with a predominantly sexual mode of transmission: Secondary | ICD-10-CM | POA: Diagnosis not present

## 2019-09-18 DIAGNOSIS — Z124 Encounter for screening for malignant neoplasm of cervix: Secondary | ICD-10-CM | POA: Diagnosis not present

## 2019-09-18 DIAGNOSIS — N390 Urinary tract infection, site not specified: Secondary | ICD-10-CM | POA: Diagnosis not present

## 2019-09-18 DIAGNOSIS — O99282 Endocrine, nutritional and metabolic diseases complicating pregnancy, second trimester: Secondary | ICD-10-CM | POA: Diagnosis not present

## 2019-09-18 DIAGNOSIS — Z3401 Encounter for supervision of normal first pregnancy, first trimester: Secondary | ICD-10-CM | POA: Diagnosis not present

## 2019-09-18 DIAGNOSIS — Z3480 Encounter for supervision of other normal pregnancy, unspecified trimester: Secondary | ICD-10-CM | POA: Diagnosis not present

## 2019-10-07 DIAGNOSIS — Z3402 Encounter for supervision of normal first pregnancy, second trimester: Secondary | ICD-10-CM | POA: Diagnosis not present

## 2019-10-15 DIAGNOSIS — Z361 Encounter for antenatal screening for raised alphafetoprotein level: Secondary | ICD-10-CM | POA: Diagnosis not present

## 2019-10-15 DIAGNOSIS — Z3402 Encounter for supervision of normal first pregnancy, second trimester: Secondary | ICD-10-CM | POA: Diagnosis not present

## 2019-10-21 DIAGNOSIS — E0590 Thyrotoxicosis, unspecified without thyrotoxic crisis or storm: Secondary | ICD-10-CM | POA: Diagnosis not present

## 2019-11-10 DIAGNOSIS — Z363 Encounter for antenatal screening for malformations: Secondary | ICD-10-CM | POA: Diagnosis not present

## 2019-11-10 DIAGNOSIS — Z3402 Encounter for supervision of normal first pregnancy, second trimester: Secondary | ICD-10-CM | POA: Diagnosis not present

## 2019-11-27 DIAGNOSIS — Z3402 Encounter for supervision of normal first pregnancy, second trimester: Secondary | ICD-10-CM | POA: Diagnosis not present

## 2019-11-27 DIAGNOSIS — Z362 Encounter for other antenatal screening follow-up: Secondary | ICD-10-CM | POA: Diagnosis not present

## 2019-12-25 DIAGNOSIS — Z3A25 25 weeks gestation of pregnancy: Secondary | ICD-10-CM | POA: Diagnosis not present

## 2019-12-25 DIAGNOSIS — O99282 Endocrine, nutritional and metabolic diseases complicating pregnancy, second trimester: Secondary | ICD-10-CM | POA: Diagnosis not present

## 2020-01-12 DIAGNOSIS — Z23 Encounter for immunization: Secondary | ICD-10-CM | POA: Diagnosis not present

## 2020-01-12 DIAGNOSIS — Z3A28 28 weeks gestation of pregnancy: Secondary | ICD-10-CM | POA: Diagnosis not present

## 2020-01-12 DIAGNOSIS — O99282 Endocrine, nutritional and metabolic diseases complicating pregnancy, second trimester: Secondary | ICD-10-CM | POA: Diagnosis not present

## 2020-01-12 DIAGNOSIS — Z3689 Encounter for other specified antenatal screening: Secondary | ICD-10-CM | POA: Diagnosis not present

## 2020-01-28 DIAGNOSIS — O99283 Endocrine, nutritional and metabolic diseases complicating pregnancy, third trimester: Secondary | ICD-10-CM | POA: Diagnosis not present

## 2020-01-28 DIAGNOSIS — Z3A30 30 weeks gestation of pregnancy: Secondary | ICD-10-CM | POA: Diagnosis not present

## 2020-02-05 ENCOUNTER — Encounter (HOSPITAL_COMMUNITY): Payer: Self-pay | Admitting: Obstetrics and Gynecology

## 2020-02-05 ENCOUNTER — Other Ambulatory Visit: Payer: Self-pay

## 2020-02-05 ENCOUNTER — Inpatient Hospital Stay (HOSPITAL_COMMUNITY)
Admission: AD | Admit: 2020-02-05 | Discharge: 2020-02-05 | Disposition: A | Discharge status: Home / Self Care | Payer: BC Managed Care – PPO | Attending: Obstetrics and Gynecology | Admitting: Obstetrics and Gynecology

## 2020-02-05 DIAGNOSIS — Z3A31 31 weeks gestation of pregnancy: Secondary | ICD-10-CM | POA: Diagnosis not present

## 2020-02-05 DIAGNOSIS — K219 Gastro-esophageal reflux disease without esophagitis: Secondary | ICD-10-CM | POA: Diagnosis not present

## 2020-02-05 DIAGNOSIS — Z79899 Other long term (current) drug therapy: Secondary | ICD-10-CM | POA: Insufficient documentation

## 2020-02-05 DIAGNOSIS — O212 Late vomiting of pregnancy: Secondary | ICD-10-CM | POA: Insufficient documentation

## 2020-02-05 DIAGNOSIS — O219 Vomiting of pregnancy, unspecified: Secondary | ICD-10-CM

## 2020-02-05 DIAGNOSIS — O99613 Diseases of the digestive system complicating pregnancy, third trimester: Secondary | ICD-10-CM

## 2020-02-05 LAB — URINALYSIS, ROUTINE W REFLEX MICROSCOPIC
Bilirubin Urine: NEGATIVE
Glucose, UA: 50 mg/dL — AB
Ketones, ur: 80 mg/dL — AB
Nitrite: NEGATIVE
Protein, ur: 100 mg/dL — AB
Specific Gravity, Urine: 1.028 (ref 1.005–1.030)
WBC, UA: >50 WBC/hpf — ABNORMAL HIGH (ref 0–5)
pH: 5 (ref 5.0–8.0)

## 2020-02-05 LAB — BASIC METABOLIC PANEL
Anion gap: 10 (ref 5–15)
BUN: 13 mg/dL (ref 6–20)
CO2: 25 mmol/L (ref 22–32)
Calcium: 9.5 mg/dL (ref 8.9–10.3)
Chloride: 102 mmol/L (ref 98–111)
Creatinine, Ser: 0.99 mg/dL (ref 0.44–1.00)
GFR calc Af Amer: >60 mL/min (ref >60)
GFR calc non Af Amer: >60 mL/min (ref >60)
Glucose, Bld: 81 mg/dL (ref 70–99)
Potassium: 3.5 mmol/L (ref 3.5–5.1)
Sodium: 137 mmol/L (ref 135–145)

## 2020-02-05 MED ORDER — LACTATED RINGERS IV BOLUS
1000.0000 mL | Freq: Once | INTRAVENOUS | Status: AC
  Administered 2020-02-05: 1000 mL via INTRAVENOUS

## 2020-02-05 MED ORDER — FAMOTIDINE IN NACL 20-0.9 MG/50ML-% IV SOLN
20.0000 mg | Freq: Once | INTRAVENOUS | Status: AC
  Administered 2020-02-05: 20 mg via INTRAVENOUS
  Filled 2020-02-05: qty 50

## 2020-02-05 MED ORDER — ONDANSETRON HCL 4 MG/2ML IJ SOLN
4.0000 mg | Freq: Once | INTRAMUSCULAR | Status: AC
  Administered 2020-02-05: 4 mg via INTRAVENOUS
  Filled 2020-02-05: qty 2

## 2020-02-05 MED ORDER — ONDANSETRON 4 MG PO TBDP: 4.0000 mg | ORAL_TABLET | Freq: Three times a day (TID) | ORAL | 0 refills | Status: DC | PRN

## 2020-02-05 NOTE — MAU Provider Note (Signed)
History     [id number]  Arrival date and time: 02/05/20 1142    Chief Complaint  Patient presents with  . Contractions     HPI Kristen Cain is a 19 y.o. at [redacted]w[redacted]d by LMP with PMHx notable for hyperthyroidism, n/v in pregnancy, who presents for nausea and emesis. She states for the past 2 days she has been unable to keep anything down, solids or liquids. She endorses multiple episodes of emesis daily. States she thinks there is dried blood in her vomit. She has notified her OBGYN who prescribed protonix, which she has taken twice without relief of her symptoms. She initially had nausea and emesis first trimester, but symptoms had since resolved. She denies any diarrhea or constipation. No melena, hematochezia, BRBPR. No abdominal pain. No lightheadedness, dizziness, syncope. No fever/chills. No sick contacts. She has received her COVID vaccinations. No urinary symptoms. Denies contractions, LOF, VB. +FM.  Review of outside prenatal records from [facility] (in media tab): all vital signs, labs, and prenatal appts reviewed.  Review of discharge summary from last admission on: all MAU notes reviewed   Review of records from [facility]: n/a  OB History    Gravida  1   Para      Term      Preterm      AB      Living        SAB      TAB      Ectopic      Multiple      Live Births              Past Medical History:  Diagnosis Date  . Asthma   . 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)     Past Surgical History:  Procedure Laterality Date  . toe nail      Family History  Problem Relation Age of Onset  . Hypertension Father   . Hypertension Mother   . Diabetes Mother        borderline    Social History   Socioeconomic History  . Marital status: Single    Spouse name: Not on file  . Number of children: Not on file  . Years of education: Not on file  . Highest education level: Not on file  Occupational History  . 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  Smoking status: Never Smoker  . Smokeless tobacco: Never Used  Vaping Use  . Vaping Use: Never used  Substance and Sexual Activity  . Alcohol use: Not Currently    Alcohol/week: 0.0 standard drinks    Comment: occ  . Drug use: Not Currently    Types: Marijuana    Comment: March 2021  . Sexual activity: Yes  Other Topics Concern  . Not on file  Social History Narrative   No smoking in house   Social Determinants of Health   Financial Resource Strain:   . Difficulty of Paying Living Expenses: Not on file  Food Insecurity:   . Worried About Programme researcher, broadcasting/film/video in the Last Year: Not on file  . Ran Out of Food in the Last Year: Not on file  Transportation Needs:   . Lack of Transportation (Medical): Not on file  . Lack of Transportation (Non-Medical): Not on file  Physical Activity:   . Days of Exercise per Week: Not on file  . Minutes of Exercise per Session: Not on file  Stress:   . Feeling of Stress : Not on file  Social Connections:   . Frequency of Communication with Friends and Family: Not on file  . Frequency of Social Gatherings with Friends and Family: Not on file  . Attends Religious Services: Not on file  . Active Member of Clubs or Organizations: Not on file  . Attends Banker Meetings: Not on file  . Marital Status: Not on file  Intimate Partner Violence:   . Fear of Current or Ex-Partner: Not on file  . Emotionally Abused: Not on file  . Physically Abused: Not on file  . Sexually Abused: Not on file    No Known Allergies  No current facility-administered medications on file prior to encounter.   Current Outpatient Medications on File Prior to Encounter  Medication Sig Dispense Refill  . metoCLOPramide (REGLAN) 10 MG tablet Take 0.5-1 tablets (5-10 mg total) by mouth every 6 (six) hours as needed for nausea. 30 tablet 0  . ondansetron (ZOFRAN ODT) 8 MG disintegrating tablet Take 1 tablet (8 mg total) by mouth 2 (two) times daily. 20 tablet 6   . pantoprazole (PROTONIX) 20 MG tablet Take 1 tablet (20 mg total) by mouth daily. 30 tablet 11  . promethazine (PHENERGAN) 25 MG suppository Place 1 suppository (25 mg total) rectally every 6 (six) hours as needed for nausea or vomiting. 30 each 8  . pyridOXINE (VITAMIN B-6) 100 MG tablet Take 100 mg by mouth daily.    Marland Kitchen scopolamine (TRANSDERM-SCOP) 1 MG/3DAYS Place 1 patch (1.5 mg total) onto the skin every 3 (three) days. 10 patch 12     ROS Pertinent positives and negative per HPI, all others reviewed and negative  Physical Exam   LMP 06/29/2019   Physical Exam Vitals and nursing note reviewed. Exam conducted with a chaperone present.  Constitutional:      General: She is not in acute distress.    Appearance: Normal appearance. She is normal weight.  HENT:     Head: Normocephalic and atraumatic.     Nose: Nose normal.     Mouth/Throat:     Mouth: Mucous membranes are moist.     Pharynx: Oropharynx is clear.  Eyes:     Extraocular Movements: Extraocular movements intact.     Conjunctiva/sclera: Conjunctivae normal.  Cardiovascular:     Rate and Rhythm: Normal rate.     Pulses: Normal pulses.  Pulmonary:     Effort: Pulmonary effort is normal.  Musculoskeletal:        General: Normal range of motion.     Cervical back: Normal range of motion and neck supple.  Skin:    General: Skin is warm and dry.  Neurological:     General: No focal deficit present.     Mental Status: She is alert and oriented to person, place, and time. Mental status is at baseline.  Psychiatric:        Mood and Affect: Mood normal.        Behavior: Behavior normal.     Cervical Exam  not indicated  Bedside Ultrasound Not indicated  My interpretation: n/a  FHT Baseline 150bpm, mod variability, +accels, -decels Toco: quiet Cat: 1  Labs No results found for this or any previous visit (from the past 24 hour(s)).  Imaging No results found.  MAU Course  Procedures  Lab Orders      Urinalysis, Routine w reflex microscopic Urine, Clean Catch No orders of the defined types were placed in this encounter.  Imaging Orders  No imaging studies ordered today  MDM moderate  Assessment and Plan  19yo G1P0 at [redacted]w[redacted]d presents to MAU for evaluation of nausea and emesis.  Nausea and emesis in pregnancy GERD Patient presents with 2d of nausea and emesis, as well as heart burn like symptoms. No sick symptoms or contacts. Has been COVID vaccinated. VSS. Exam unremarkable. FHT reassuring. BMP unremarkable. UA with small blood, large leukocytes, will send for culture. Given LR bolus, pepcid and zofran in MAU with improvement in symptoms. Passed PO challenge. Suspect patient's symptoms most likely due to GERD given her gestational age. Advised to take protonix twice daily that she was previously prescribed. Sent with zofran rx for nausea. Patient encouraged to use multiple anti-emetic prescriptions previously prescribed as well. Encouraged increase PO intake, including water, gatorade, pedialyte. Encouraged small frequent meals. Follow up with OBGYN. Patient amendable to plan and voiced understanding.  #FWB FHT Cat 1 NST: reactive  Alric Seton

## 2020-02-05 NOTE — MAU Note (Signed)
.   Kristen Cain is a 19 y.o. at [redacted]w[redacted]d here in MAU reporting: she has not been able to keep anything down since Monday. Reports she was given meds for reflux but they are not working. Denies any pain or VB  Onset of complaint: Monday 13th Pain score: 0 Vitals:   02/05/20 1157  BP: 119/74  Pulse: (!) 106  Resp: 16  Temp: 98.1 F (36.7 C)  SpO2: 100%     FHT:140 Lab orders placed from triage: UA

## 2020-02-05 NOTE — Discharge Instructions (Signed)
-zofran prescription sent to your pharmacy, take as needed if the meds below dont work -unisom and vitamin b6 over the counter for nausea -can try magnesium as well -stay hydrated -small frequent meals  Safe Medications in Pregnancy   Acne:  Benzoyl Peroxide  Salicylic Acid   Backache/Headache:  Tylenol: 2 regular strength every 4 hours OR        2 Extra strength every 6 hours   Colds/Coughs/Allergies:  Benadryl (alcohol free) 25 mg every 6 hours as needed  Breath right strips  Claritin  Cepacol throat lozenges  Chloraseptic throat spray  Cold-Eeze- up to three times per day  Cough drops, alcohol free  Flonase (by prescription only)  Guaifenesin  Mucinex  Robitussin DM (plain only, alcohol free)  Saline nasal spray/drops  Sudafed (pseudoephedrine) & Actifed * use only after [redacted] weeks gestation and if you do not have high blood pressure  Tylenol  Vicks Vaporub  Zinc lozenges  Zyrtec   Constipation:  Colace  Ducolax suppositories  Fleet enema  Glycerin suppositories  Metamucil  Milk of magnesia  Miralax  Senokot  Smooth move tea   Diarrhea:  Kaopectate  Imodium A-D   *NO pepto Bismol   Hemorrhoids:  Anusol  Anusol HC  Preparation H  Tucks   Indigestion:  Tums  Maalox  Mylanta  Zantac  Pepcid   Insomnia:  Benadryl (alcohol free) 25mg  every 6 hours as needed  Tylenol PM  Unisom, no Gelcaps   Leg Cramps:  Tums  MagGel   Nausea/Vomiting:  Bonine  Dramamine  Emetrol  Ginger extract  Sea bands  Meclizine  Nausea medication to take during pregnancy:  Unisom (doxylamine succinate 25 mg tablets) Take one tablet daily at bedtime. If symptoms are not adequately controlled, the dose can be increased to a maximum recommended dose of two tablets daily (1/2 tablet in the morning, 1/2 tablet mid-afternoon and one at bedtime).  Vitamin B6 100mg  tablets. Take one tablet twice a day (up to 200 mg per day).   Skin Rashes:  Aveeno products    Benadryl cream or 25mg  every 6 hours as needed  Calamine Lotion  1% cortisone cream   Yeast infection:  Gyne-lotrimin 7  Monistat 7    **If taking multiple medications, please check labels to avoid duplicating the same active ingredients  **take medication as directed on the label  ** Do not exceed 4000 mg of tylenol in 24 hours  **Do not take medications that contain aspirin or ibuprofen           Gastroesophageal Reflux Disease, Adult Gastroesophageal reflux (GER) happens when acid from the stomach flows up into the tube that connects the mouth and the stomach (esophagus). Normally, food travels down the esophagus and stays in the stomach to be digested. With GER, food and stomach acid sometimes move back up into the esophagus. You may have a disease called gastroesophageal reflux disease (GERD) if the reflux:  Happens often.  Causes frequent or very bad symptoms.  Causes problems such as damage to the esophagus. When this happens, the esophagus becomes sore and swollen (inflamed). Over time, GERD can make small holes (ulcers) in the lining of the esophagus. What are the causes? This condition is caused by a problem with the muscle between the esophagus and the stomach. When this muscle is weak or not normal, it does not close properly to keep food and acid from coming back up from the stomach. The muscle can be weak because of:  Tobacco use.  Pregnancy.  Having a certain type of hernia (hiatal hernia).  Alcohol use.  Certain foods and drinks, such as coffee, chocolate, onions, and peppermint. What increases the risk? You are more likely to develop this condition if you:  Are overweight.  Have a disease that affects your connective tissue.  Use NSAID medicines. What are the signs or symptoms? Symptoms of this condition include:  Heartburn.  Difficult or painful swallowing.  The feeling of having a lump in the throat.  A bitter taste in the mouth.  Bad  breath.  Having a lot of saliva.  Having an upset or bloated stomach.  Belching.  Chest pain. Different conditions can cause chest pain. Make sure you see your doctor if you have chest pain.  Shortness of breath or noisy breathing (wheezing).  Ongoing (chronic) cough or a cough at night.  Wearing away of the surface of teeth (tooth enamel).  Weight loss. How is this treated? Treatment will depend on how bad your symptoms are. Your doctor may suggest:  Changes to your diet.  Medicine.  Surgery. Follow these instructions at home: Eating and drinking   Follow a diet as told by your doctor. You may need to avoid foods and drinks such as: ? Coffee and tea (with or without caffeine). ? Drinks that contain alcohol. ? Energy drinks and sports drinks. ? Bubbly (carbonated) drinks or sodas. ? Chocolate and cocoa. ? Peppermint and mint flavorings. ? Garlic and onions. ? Horseradish. ? Spicy and acidic foods. These include peppers, chili powder, curry powder, vinegar, hot sauces, and BBQ sauce. ? Citrus fruit juices and citrus fruits, such as oranges, lemons, and limes. ? Tomato-based foods. These include red sauce, chili, salsa, and pizza with red sauce. ? Fried and fatty foods. These include donuts, french fries, potato chips, and high-fat dressings. ? High-fat meats. These include hot dogs, rib eye steak, sausage, ham, and bacon. ? High-fat dairy items, such as whole milk, butter, and cream cheese.  Eat small meals often. Avoid eating large meals.  Avoid drinking large amounts of liquid with your meals.  Avoid eating meals during the 2-3 hours before bedtime.  Avoid lying down right after you eat.  Do not exercise right after you eat. Lifestyle   Do not use any products that contain nicotine or tobacco. These include cigarettes, e-cigarettes, and chewing tobacco. If you need help quitting, ask your doctor.  Try to lower your stress. If you need help doing this, ask  your doctor.  If you are overweight, lose an amount of weight that is healthy for you. Ask your doctor about a safe weight loss goal. General instructions  Pay attention to any changes in your symptoms.  Take over-the-counter and prescription medicines only as told by your doctor. Do not take aspirin, ibuprofen, or other NSAIDs unless your doctor says it is okay.  Wear loose clothes. Do not wear anything tight around your waist.  Raise (elevate) the head of your bed about 6 inches (15 cm).  Avoid bending over if this makes your symptoms worse.  Keep all follow-up visits as told by your doctor. This is important. Contact a doctor if:  You have new symptoms.  You lose weight and you do not know why.  You have trouble swallowing or it hurts to swallow.  You have wheezing or a cough that keeps happening.  Your symptoms do not get better with treatment.  You have a hoarse voice. Get help right away if:  You have pain in your arms, neck, jaw, teeth, or back.  You feel sweaty, dizzy, or light-headed.  You have chest pain or shortness of breath.  You throw up (vomit) and your throw-up looks like blood or coffee grounds.  You pass out (faint).  Your poop (stool) is bloody or black.  You cannot swallow, drink, or eat. Summary  If a person has gastroesophageal reflux disease (GERD), food and stomach acid move back up into the esophagus and cause symptoms or problems such as damage to the esophagus.  Treatment will depend on how bad your symptoms are.  Follow a diet as told by your doctor.  Take all medicines only as told by your doctor. This information is not intended to replace advice given to you by your health care provider. Make sure you discuss any questions you have with your health care provider. Document Revised: 11/14/2017 Document Reviewed: 11/14/2017 Elsevier Patient Education  2020 [employer].

## 2020-02-06 LAB — CULTURE, OB URINE: Culture: <10000 — AB

## 2020-02-12 DIAGNOSIS — Z3403 Encounter for supervision of normal first pregnancy, third trimester: Secondary | ICD-10-CM | POA: Diagnosis not present

## 2020-02-26 DIAGNOSIS — Z3403 Encounter for supervision of normal first pregnancy, third trimester: Secondary | ICD-10-CM | POA: Diagnosis not present

## 2020-03-24 LAB — OB RESULTS CONSOLE GBS: GBS: NEGATIVE

## 2020-04-01 ENCOUNTER — Inpatient Hospital Stay (HOSPITAL_COMMUNITY): Payer: 59 | Admitting: Anesthesiology

## 2020-04-01 ENCOUNTER — Inpatient Hospital Stay (HOSPITAL_COMMUNITY)
Admission: AD | Admit: 2020-04-01 | Discharge: 2020-04-03 | DRG: 807 | Disposition: A | Discharge status: Home / Self Care | Payer: 59 | Attending: Obstetrics and Gynecology | Admitting: Obstetrics and Gynecology

## 2020-04-01 ENCOUNTER — Other Ambulatory Visit: Payer: Self-pay

## 2020-04-01 ENCOUNTER — Encounter (HOSPITAL_COMMUNITY): Payer: Self-pay | Admitting: Obstetrics and Gynecology

## 2020-04-01 DIAGNOSIS — E860 Dehydration: Principal | ICD-10-CM

## 2020-04-01 DIAGNOSIS — Z3A39 39 weeks gestation of pregnancy: Secondary | ICD-10-CM

## 2020-04-01 DIAGNOSIS — R000 Tachycardia, unspecified: Secondary | ICD-10-CM

## 2020-04-01 DIAGNOSIS — Z20822 Contact with and (suspected) exposure to covid-19: Secondary | ICD-10-CM | POA: Diagnosis present

## 2020-04-01 DIAGNOSIS — O26893 Other specified pregnancy related conditions, third trimester: Secondary | ICD-10-CM | POA: Diagnosis present

## 2020-04-01 DIAGNOSIS — O99280 Endocrine, nutritional and metabolic diseases complicating pregnancy, unspecified trimester: Secondary | ICD-10-CM

## 2020-04-01 LAB — CBC
HCT: 35 % — ABNORMAL LOW (ref 36.0–46.0)
Hemoglobin: 11.4 g/dL — ABNORMAL LOW (ref 12.0–15.0)
MCH: 30 pg (ref 26.0–34.0)
MCHC: 32.6 g/dL (ref 30.0–36.0)
MCV: 92.1 fL (ref 80.0–100.0)
Platelets: 236 10*3/uL (ref 150–400)
RBC: 3.8 MIL/uL — ABNORMAL LOW (ref 3.87–5.11)
RDW: 13.1 % (ref 11.5–15.5)
WBC: 12.4 10*3/uL — ABNORMAL HIGH (ref 4.0–10.5)
nRBC: 0 % (ref 0.0–0.2)

## 2020-04-01 LAB — RESPIRATORY PANEL BY RT PCR (FLU A&B, COVID)
Influenza A by PCR: NEGATIVE
Influenza B by PCR: NEGATIVE
SARS Coronavirus 2 by RT PCR: NEGATIVE

## 2020-04-01 LAB — TYPE AND SCREEN
ABO/RH(D): B POS
Antibody Screen: NEGATIVE

## 2020-04-01 MED ORDER — FERROUS SULFATE 325 (65 FE) MG PO TABS
325.0000 mg | ORAL_TABLET | Freq: Two times a day (BID) | ORAL | Status: DC
  Administered 2020-04-01 – 2020-04-03 (×4): 325 mg via ORAL
  Filled 2020-04-01 (×4): qty 1

## 2020-04-01 MED ORDER — ACETAMINOPHEN 325 MG PO TABS: 650.0000 mg | ORAL_TABLET | ORAL | Status: DC | PRN

## 2020-04-01 MED ORDER — SOD CITRATE-CITRIC ACID 500-334 MG/5ML PO SOLN
30.0000 mL | ORAL | Status: DC | PRN
  Administered 2020-04-01: 30 mL via ORAL
  Filled 2020-04-01: qty 15

## 2020-04-01 MED ORDER — OXYCODONE-ACETAMINOPHEN 5-325 MG PO TABS: 1.0000 | ORAL_TABLET | ORAL | Status: DC | PRN

## 2020-04-01 MED ORDER — OXYCODONE HCL 5 MG PO TABS: 10.0000 mg | ORAL_TABLET | ORAL | Status: DC | PRN

## 2020-04-01 MED ORDER — SENNOSIDES-DOCUSATE SODIUM 8.6-50 MG PO TABS
2.0000 | ORAL_TABLET | ORAL | Status: DC
  Administered 2020-04-02 – 2020-04-03 (×2): 2 via ORAL
  Filled 2020-04-01 (×2): qty 2

## 2020-04-01 MED ORDER — OXYTOCIN BOLUS FROM INFUSION
333.0000 mL | Freq: Once | INTRAVENOUS | Status: AC
  Administered 2020-04-01: 333 mL via INTRAVENOUS

## 2020-04-01 MED ORDER — IBUPROFEN 600 MG PO TABS
600.0000 mg | ORAL_TABLET | Freq: Four times a day (QID) | ORAL | Status: DC
  Administered 2020-04-01 – 2020-04-03 (×7): 600 mg via ORAL
  Filled 2020-04-01 (×7): qty 1

## 2020-04-01 MED ORDER — WITCH HAZEL-GLYCERIN EX PADS: 1.0000 "application " | MEDICATED_PAD | CUTANEOUS | Status: DC | PRN

## 2020-04-01 MED ORDER — PHENYLEPHRINE 40 MCG/ML (10ML) SYRINGE FOR IV PUSH (FOR BLOOD PRESSURE SUPPORT): 80.0000 ug | PREFILLED_SYRINGE | INTRAVENOUS | Status: DC | PRN

## 2020-04-01 MED ORDER — DIBUCAINE (PERIANAL) 1 % EX OINT: 1.0000 "application " | TOPICAL_OINTMENT | CUTANEOUS | Status: DC | PRN

## 2020-04-01 MED ORDER — EPHEDRINE 5 MG/ML INJ: 10.0000 mg | INTRAVENOUS | Status: DC | PRN

## 2020-04-01 MED ORDER — DIPHENHYDRAMINE HCL 50 MG/ML IJ SOLN: 12.5000 mg | INTRAMUSCULAR | Status: DC | PRN

## 2020-04-01 MED ORDER — ONDANSETRON HCL 4 MG/2ML IJ SOLN: 4.0000 mg | INTRAMUSCULAR | Status: DC | PRN

## 2020-04-01 MED ORDER — SIMETHICONE 80 MG PO CHEW: 80.0000 mg | CHEWABLE_TABLET | ORAL | Status: DC | PRN

## 2020-04-01 MED ORDER — OXYCODONE HCL 5 MG PO TABS: 5.0000 mg | ORAL_TABLET | ORAL | Status: DC | PRN

## 2020-04-01 MED ORDER — PRENATAL MULTIVITAMIN CH
1.0000 | ORAL_TABLET | Freq: Every day | ORAL | Status: DC
  Administered 2020-04-02 – 2020-04-03 (×2): 1 via ORAL
  Filled 2020-04-01 (×2): qty 1

## 2020-04-01 MED ORDER — ONDANSETRON HCL 4 MG PO TABS: 4.0000 mg | ORAL_TABLET | ORAL | Status: DC | PRN

## 2020-04-01 MED ORDER — FENTANYL-BUPIVACAINE-NACL 0.5-0.125-0.9 MG/250ML-% EP SOLN
12.0000 mL/h | EPIDURAL | Status: DC | PRN
  Filled 2020-04-01: qty 250

## 2020-04-01 MED ORDER — ONDANSETRON HCL 4 MG/2ML IJ SOLN: 4.0000 mg | Freq: Four times a day (QID) | INTRAMUSCULAR | Status: DC | PRN

## 2020-04-01 MED ORDER — LIDOCAINE HCL (PF) 1 % IJ SOLN
30.0000 mL | INTRAMUSCULAR | Status: AC | PRN
  Administered 2020-04-01: 30 mL via SUBCUTANEOUS
  Filled 2020-04-01: qty 30

## 2020-04-01 MED ORDER — OXYCODONE-ACETAMINOPHEN 5-325 MG PO TABS: 2.0000 | ORAL_TABLET | ORAL | Status: DC | PRN

## 2020-04-01 MED ORDER — BENZOCAINE-MENTHOL 20-0.5 % EX AERO
1.0000 "application " | INHALATION_SPRAY | CUTANEOUS | Status: DC | PRN
  Administered 2020-04-01: 1 via TOPICAL
  Filled 2020-04-01: qty 56

## 2020-04-01 MED ORDER — LIDOCAINE HCL (PF) 1 % IJ SOLN
INTRAMUSCULAR | Status: DC | PRN
  Administered 2020-04-01: 5 mL via EPIDURAL
  Administered 2020-04-01: 7 mL via EPIDURAL

## 2020-04-01 MED ORDER — LACTATED RINGERS IV SOLN: 500.0000 mL | INTRAVENOUS | Status: DC | PRN

## 2020-04-01 MED ORDER — FLEET ENEMA 7-19 GM/118ML RE ENEM: 1.0000 | ENEMA | RECTAL | Status: DC | PRN

## 2020-04-01 MED ORDER — LACTATED RINGERS IV SOLN
500.0000 mL | Freq: Once | INTRAVENOUS | Status: AC
  Administered 2020-04-01: 500 mL via INTRAVENOUS

## 2020-04-01 MED ORDER — LACTATED RINGERS IV SOLN: INTRAVENOUS | Status: DC

## 2020-04-01 MED ORDER — COCONUT OIL OIL: 1.0000 "application " | TOPICAL_OIL | Status: DC | PRN

## 2020-04-01 MED ORDER — SODIUM CHLORIDE (PF) 0.9 % IJ SOLN
INTRAMUSCULAR | Status: DC | PRN
  Administered 2020-04-01: 12 mL/h via EPIDURAL

## 2020-04-01 MED ORDER — ZOLPIDEM TARTRATE 5 MG PO TABS: 5.0000 mg | ORAL_TABLET | Freq: Every evening | ORAL | Status: DC | PRN

## 2020-04-01 MED ORDER — OXYTOCIN-SODIUM CHLORIDE 30-0.9 UT/500ML-% IV SOLN
2.5000 [IU]/h | INTRAVENOUS | Status: DC
  Administered 2020-04-01: 2.5 [IU]/h via INTRAVENOUS
  Filled 2020-04-01: qty 500

## 2020-04-01 MED ORDER — DIPHENHYDRAMINE HCL 25 MG PO CAPS: 25.0000 mg | ORAL_CAPSULE | Freq: Four times a day (QID) | ORAL | Status: DC | PRN

## 2020-04-01 NOTE — H&P (Signed)
Kristen Cain is a 19 y.o. female presenting at term in active labor. GBS cx neg. OB History    Gravida  1   Para      Term      Preterm      AB      Living        SAB      TAB      Ectopic      Multiple      Live Births             Past Medical History:  Diagnosis Date  . as a child   . 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)    Past Surgical History:  Procedure Laterality Date  . toe nail     Family History: family history includes Diabetes in her mother; Hypertension in her father and mother. Social History:  reports that she has never smoked. She has never used smokeless tobacco. She reports previous alcohol use. She reports current drug use. Drug: Marijuana.     Maternal Diabetes: No Genetic Screening: Normal Maternal Ultrasounds/Referrals: Normal Fetal Ultrasounds or other Referrals:  None Maternal Substance Abuse:  No Significant Maternal Medications:  None Significant Maternal Lab Results:  Group B Strep negative Other Comments:  transiently followed for hyperthyroidism  w/o medication  Review of Systems  All other systems reviewed and are negative.  History Dilation: 10 Effacement (%): 100 Station: 0 Exam by:: Kristen brown RN Blood pressure 118/75, pulse (!) 108, temperature 98.7 F (37.1 C), temperature source Oral, resp. rate 16, height 5\' 5"  (1.651 m), weight 84.7 kg, last menstrual period 06/29/2019, SpO2 100 %. Exam Physical Exam Constitutional:      General: She is in acute distress.  Eyes:     Extraocular Movements: Extraocular movements intact.  Cardiovascular:     Rate and Rhythm: Regular rhythm.  Pulmonary:     Effort: Pulmonary effort is normal.  Musculoskeletal:        General: No swelling.     Cervical back: Neck supple.  Skin:    General: Skin is warm and dry.  Neurological:     Mental Status: She is alert.  Psychiatric:        Mood and Affect: Mood normal.     Prenatal labs: ABO, Rh: --/--/B POS (11/11 1003) Antibody: NEG (11/11  1003) Rubella:  Immune RPR:   NR HBsAg:   neg HIV:   neg GBS: Negative/-- (11/03 0000)   Assessment/Plan: Labor  Term  P) admit routine labs. Epidural . Amniotomy prn   Kristen Cain A Kristen Cain 04/01/2020, 12:44 PM

## 2020-04-01 NOTE — Anesthesia Preprocedure Evaluation (Signed)
Anesthesia Evaluation  Patient identified by MRN, date of birth, ID band Patient awake    Reviewed: Allergy & Precautions, H&P , NPO status , Patient's Chart, lab work & pertinent test results  Airway Mallampati: I  TM Distance: >3 FB Neck ROM: full    Dental no notable dental hx.    Pulmonary    Pulmonary exam normal breath sounds clear to auscultation       Cardiovascular negative cardio ROS Normal cardiovascular exam Rhythm:regular Rate:Normal     Neuro/Psych negative neurological ROS  negative psych ROS   GI/Hepatic negative GI ROS, Neg liver ROS,   Endo/Other  negative endocrine ROS  Renal/GU negative Renal ROS  negative genitourinary   Musculoskeletal negative musculoskeletal ROS (+)   Abdominal Normal abdominal exam  (+)   Peds  Hematology  (+) Blood dyscrasia, anemia ,   Anesthesia Other Findings   Reproductive/Obstetrics (+) Pregnancy                             Anesthesia Physical Anesthesia Plan  ASA: II  Anesthesia Plan: Epidural   Post-op Pain Management:    Induction:   PONV Risk Score and Plan:   Airway Management Planned:   Additional Equipment:   Intra-op Plan:   Post-operative Plan:   Informed Consent: I have reviewed the patients History and Physical, chart, labs and discussed the procedure including the risks, benefits and alternatives for the proposed anesthesia with the patient or authorized representative who has indicated his/her understanding and acceptance.       Plan Discussed with:   Anesthesia Plan Comments:         Anesthesia Quick Evaluation

## 2020-04-01 NOTE — MAU Note (Signed)
Presents with c/o ctxs every 4 minutes that began @ 0700 this morning.  Reports +FM, but decreased.  Denies LOF and VB.

## 2020-04-01 NOTE — Anesthesia Procedure Notes (Signed)
Epidural Patient location during procedure: OB Start time: 04/01/2020 10:54 AM End time: 04/01/2020 10:57 AM  Staffing Anesthesiologist: Leilani Able, MD Performed: anesthesiologist   Preanesthetic Checklist Completed: patient identified, IV checked, site marked, risks and benefits discussed, surgical consent, monitors and equipment checked, pre-op evaluation and timeout performed  Epidural Patient position: sitting Prep: DuraPrep and site prepped and draped Patient monitoring: continuous pulse ox and blood pressure Approach: midline Location: L3-L4 Injection technique: LOR air  Needle:  Needle type: Tuohy  Needle gauge: 17 G Needle length: 9 cm and 9 Needle insertion depth: 7 cm Catheter type: closed end flexible Catheter size: 19 Gauge Catheter at skin depth: 12 cm Test dose: negative and Other  Assessment Events: blood not aspirated, injection not painful, no injection resistance, no paresthesia and negative IV test  Additional Notes Reason for block:procedure for pain

## 2020-04-02 LAB — CBC
HCT: 29.6 % — ABNORMAL LOW (ref 36.0–46.0)
Hemoglobin: 9.5 g/dL — ABNORMAL LOW (ref 12.0–15.0)
MCH: 29.5 pg (ref 26.0–34.0)
MCHC: 32.1 g/dL (ref 30.0–36.0)
MCV: 91.9 fL (ref 80.0–100.0)
Platelets: 189 10*3/uL (ref 150–400)
RBC: 3.22 MIL/uL — ABNORMAL LOW (ref 3.87–5.11)
RDW: 13.2 % (ref 11.5–15.5)
WBC: 10.5 10*3/uL (ref 4.0–10.5)
nRBC: 0 % (ref 0.0–0.2)

## 2020-04-02 LAB — RPR: RPR Ser Ql: NONREACTIVE

## 2020-04-02 NOTE — Lactation Note (Signed)
This note was copied from a baby's chart. Lactation Consultation Note  Patient Name: Girl Phinley Schall Today's Date: 04/02/2020 Reason for consult: Initial assessment;Primapara;1st time breastfeeding;Term Type of Endocrine Disorder?: Thyroid  Baby is 20 hours old  Per mom came in with feeding preference to formula feed and during the night  And tried breast feeding. Mom expressed she feels breast feeding could be time consuming and may only breast feed for 6 weeks.  Mom when one to say she may breast feed longer.  LC reassured mom she has time to decide and there is many options.  Baby awake and rooting. LC reviewed hand expressing with permission and mom able to hand express after instructions with drops. Per mom breast changes with pregnancy and some leakage.  Baby opened wide and latched with depth and fed for 5 mins with swallows  and released. Nipple well rounded. Baby fell asleep in moms arms.  LC encouraged mom to call with feeding cues.  Per mom does not have a pump at home.    Maternal Data Has patient been taught Hand Expression?: Yes Does the patient have breastfeeding experience prior to this delivery?: No  Feeding Feeding Type: Breast Fed  LATCH Score Latch: Grasps breast easily, tongue down, lips flanged, rhythmical sucking.  Audible Swallowing: A few with stimulation  Type of Nipple: Everted at rest and after stimulation  Comfort (Breast/Nipple): Soft / non-tender  Hold (Positioning): Assistance needed to correctly position infant at breast and maintain latch.  LATCH Score: 8  Interventions Interventions: Breast feeding basics reviewed;Assisted with latch;Skin to skin;Breast massage;Hand express;Breast compression;Adjust position;Support pillows;Position options  Lactation Tools Discussed/Used     Consult Status Consult Status: Follow-up Date: 04/02/20 Follow-up type: In-patient    [name] Kiya Eno 04/02/2020, 9:49 AM

## 2020-04-02 NOTE — Social Work (Signed)
CSW received and acknowledges consult for EDPS of 9.  Consult screened out due to 9 on EDPS does not warrant a CSW consult.  MOB whom scores are greater than 9/yes to question 10 on Edinburgh Postpartum Depression Screen warrants a CSW consult.   Jovany Disano, LCSWA Clinical Social Work [facility] [phone] 

## 2020-04-02 NOTE — Anesthesia Postprocedure Evaluation (Signed)
Anesthesia Post Note  Patient: Zachery Dauer  Procedure(s) Performed: AN AD HOC LABOR EPIDURAL     Patient location during evaluation: Mother Baby Anesthesia Type: Epidural Level of consciousness: awake and alert, oriented and patient cooperative Pain management: pain level controlled Vital Signs Assessment: post-procedure vital signs reviewed and stable Respiratory status: spontaneous breathing Cardiovascular status: stable Postop Assessment: no headache, epidural receding, patient able to bend at knees and no signs of nausea or vomiting Anesthetic complications: no Comments: Pt. States she is walking.  Pain score 0.    No complications documented.  Last Vitals:  Vitals:   04/02/20 0021 04/02/20 0522  BP: (!) 108/56 120/86  Pulse: 70 80  Resp: 18 18  Temp: 37.1 C 36.5 C  SpO2:      Last Pain:  Vitals:   04/02/20 0721  TempSrc:   PainSc: 0-No pain   Pain Goal:                   [facility]

## 2020-04-02 NOTE — Progress Notes (Signed)
PPD #1 SVD:   S:  Pt reports feeling well / Tolerating po/ Voiding without problems/ No n/v/ Bleeding is  moderate/ Pain controlled with motrin  Newborn info live female BRF   O:  A & O x 3 / VS: Blood pressure 120/86, pulse 80, temperature 97.7 F (36.5 C), temperature source Oral, resp. rate 18, height 5\' 5"  (1.651 m), weight 84.7 kg, last menstrual period 06/29/2019, SpO2 100 %, unknown if currently breastfeeding.  LABS:  Results for orders placed or performed during the hospital encounter of 04/01/20 (from the past 24 hour(s))  CBC     Status: Abnormal   Collection Time: 04/02/20  5:33 AM  Result Value Ref Range   WBC 10.5 4.0 - 10.5 K/uL   RBC 3.22 (L) 3.87 - 5.11 MIL/uL   Hemoglobin 9.5 (L) 12.0 - 15.0 g/dL   HCT 13/12/21 (L) 36 - 46 %   MCV 91.9 80.0 - 100.0 fL   MCH 29.5 26.0 - 34.0 pg   MCHC 32.1 30.0 - 36.0 g/dL   RDW 15.1 76.1 - 60.7 %   Platelets 189 150 - 400 K/uL   nRBC 0.0 0.0 - 0.2 %    I&O: I/O last 3 completed shifts: In: -  Out: 200 [Blood:200]   No intake/output data recorded.  Lungs: chest clear, no wheezing, rales, normal symmetric air entry  Heart: regular rate and rhythm, S1, S2 normal, no murmur, click, rub or gallop  Abdomen: soft uterine fundus 2FB below umbilicus  Perineum: is normal  Lochia: mod  Extremities:no edema or calf tenderness    A/P: PPD # 1/ G1P1001  Doing well  Continue routine post partum orders  Anticipate d/c in am  will get breast pump

## 2020-04-03 MED ORDER — IBUPROFEN 600 MG PO TABS
600.0000 mg | ORAL_TABLET | Freq: Four times a day (QID) | ORAL | 11 refills | Status: DC | PRN
Start: 2020-04-03 — End: 2023-01-02

## 2020-04-03 NOTE — Discharge Summary (Signed)
Postpartum Discharge Summary  Date of Service updated 04/03/2020     Patient Name: Kristen Cain DOB: [birth year] MRN: [id number]  Date of admission: 04/01/2020 Delivery date:04/01/2020  Delivering provider: Nohlan Burdin  Date of discharge: 04/03/2020  Admitting diagnosis: Normal labor [O80, Z37.9] Indication for care in labor or delivery [O75.9] Postpartum care following vaginal delivery [Z39.2] Intrauterine pregnancy: [redacted]w[redacted]d    Secondary diagnosis:  Active Problems:   Normal labor   Indication for care in labor or delivery   Postpartum care following vaginal delivery  Additional problems:none   Discharge diagnosis: Term Pregnancy Delivered                                              Post partum procedures: none Augmentation: N/A Complications: None  Hospital course: Onset of Labor With Vaginal Delivery      19y.o. yo G1P1001 at 336w4das admitted in Active Labor on 04/01/2020. Patient had an uncomplicated labor course as follows:  Membrane Rupture Time/Date: 11:50 AM ,04/01/2020   Delivery Method:Vaginal, Vacuum (Extractor)  Episiotomy: None  Lacerations:  Labial  minora bilateral Patient had an uncomplicated postpartum course.  She is ambulating, tolerating a regular diet, passing flatus, and urinating well. Patient is discharged home in stable condition on 04/03/20.  Newborn Data: Birth date:04/01/2020  Birth time:12:54 PM  Gender:Female  Living status:Living  Apgars:9 ,9  Weight:3.351 kg   Magnesium Sulfate received: No BMZ received: No Rhophylac:No MMR:No T-DaP:Given prenatally Flu: No Transfusion:No  Physical exam  Vitals:   04/02/20 0021 04/02/20 0522 04/02/20 2147 04/03/20 0736  BP: (!) 108/56 120/86 (!) 100/53 111/72  Pulse: 70 80 69 (!) 54  Resp: _0 Temp: 98.7 F (37.1 C) 97.7 F (36.5 C) 98.7 F (37.1 C) 98 F (36.7 C)  TempSrc: Oral Oral Oral Oral  SpO2:   99%   Weight:      Height:       General: alert,  cooperative, and no distress Lochia: appropriate Uterine Fundus: firm Incision: N/A DVT Evaluation: No evidence of DVT seen on physical exam. Labs: Lab Results  Component Value Date   WBC 10.5 04/02/2020   HGB 9.5 (L) 04/02/2020   HCT 29.6 (L) 04/02/2020   MCV 91.9 04/02/2020   PLT 189 04/02/2020   CMP Latest Ref Rng & Units 02/05/2020  Glucose 70 - 99 mg/dL 81  BUN 6 - 20 mg/dL 13  Creatinine 0.44 - 1.00 mg/dL 0.99  Sodium 135 - 145 mmol/L 137  Potassium 3.5 - 5.1 mmol/L 3.5  Chloride 98 - 111 mmol/L 102  CO2 22 - 32 mmol/L 25  Calcium 8.9 - 10.3 mg/dL 9.5  Total Protein 6.5 - 8.1 g/dL -  Total Bilirubin 0.3 - 1.2 mg/dL -  Alkaline Phos 38 - 126 U/L -  AST 15 - 41 U/L -  ALT 0 - 44 U/L -   Edinburgh Score: Edinburgh Postnatal Depression Scale Screening Tool 04/01/2020  I have been able to laugh and see the funny side of things. 0  I have looked forward with enjoyment to things. 0  I have blamed myself unnecessarily when things went wrong. 2  I have been anxious or worried for no good reason. 2  I have felt scared or panicky for no good reason. 2  Things have been getting on top of me.  1  I have been so unhappy that I have had difficulty sleeping. 0  I have felt sad or miserable. 1  I have been so unhappy that I have been crying. 1  The thought of harming myself has occurred to me. 0  Edinburgh Postnatal Depression Scale Total 9      After visit meds:  Allergies as of 04/03/2020   No Known Allergies      Medication List     TAKE these medications    ibuprofen 600 MG tablet Commonly known as: ADVIL Take 1 tablet (600 mg total) by mouth every 6 (six) hours as needed.         Discharge home in stable condition Infant Feeding: Bottle and Breast Infant Disposition:home with mother Discharge instruction: per After Visit Summary and Postpartum booklet. Activity: Advance as tolerated. Pelvic rest for 6 weeks.  Diet: routine diet Anticipated Birth  Control: Nexplanon Postpartum Appointment:6 weeks Additional Postpartum F/U:  none Future Appointments:No future appointments. Follow up Visit:  Follow-up Information     Servando Salina, MD Follow up in 6 week(s).   Specialty: Obstetrics and Gynecology Contact information: [address] [place] [address] [phone]                     04/03/2020 Marvene Staff, MD

## 2020-04-03 NOTE — Lactation Note (Signed)
This note was copied from a baby's chart. Lactation Consultation Note  Patient Name: Kristen Cain [id number] Date: 04/03/2020 Reason for consult: Follow-up assessment;Infant weight loss;Term;Primapara;1st time breastfeeding Type of Endocrine Disorder?: Thyroid  Baby is 44 hours old , 6 % weight loss, Serum Bilirubin - 9.7 - Low intermediate  Per mom the baby just finished feeding 20 mins prior to [facility] coming in. Mom changed a large mec stool and then attempted to latch and the baby didn't seem interested. LC reviewed how deep onto the areola baby's mouth should be when latched with permission and noted some areola edema.  LC updated the doc flow sheet per mom.  Mom denies soreness, sore nipple and engorgement prevention and tx reviewed.  LC provided the breast shells for between feedings except when sleeping if needed for areola edema. Hand pump with the #24 F and #27 F for when the milk comes in. Storage of breast milk.  LC provided mom with the resource pamphlet with phone numbers and the website for support groups.    Maternal Data    Feeding Feeding Type: Breast Fed  LATCH Score ( Baby sleepy, recently fed )  Latch: Too sleepy or reluctant, no latch achieved, no sucking elicited. (baby sleepy , not interested and recently fed 20 mins prior to stool per mom)  Audible Swallowing: None  Type of Nipple: Everted at rest and after stimulation  Comfort (Breast/Nipple): Filling, red/small blisters or bruises, mild/mod discomfort  Hold (Positioning): Assistance needed to correctly position infant at breast and maintain latch.  LATCH Score: 4  Interventions Interventions: Breast feeding basics reviewed;Assisted with latch;Skin to skin;Hand express;Adjust position;Support pillows  Lactation Tools Discussed/Used Tools: Shells;Pump;Flanges Flange Size: 24;27 Shell Type: Inverted Breast pump type: Manual WIC Program: No Pump Review: Setup, frequency, and cleaning;Milk  Storage Initiated by:: MAI Date initiated:: 04/03/20   Consult Status Consult Status: Complete Date: 04/03/20 Follow-up type: In-patient    [name] [name] 04/03/2020, 9:05 AM

## 2020-04-03 NOTE — Progress Notes (Signed)
PPD2 SVD:   S:  Pt reports feeling  well. Ready to go home/ Tolerating po/ Voiding without problems/ No n/v/ Bleeding is light/ Pain controlled withprescription NSAID's including motrin  Newborn info  live female   O:  A & O x 3 / VS: Blood pressure 111/72, pulse (!) 54, temperature 98 F (36.7 C), temperature source Oral, resp. rate 16, height 5\' 5"  (1.651 m), weight 84.7 kg, last menstrual period 06/29/2019, SpO2 99 %, unknown if currently breastfeeding.  LABS: No results found for this or any previous visit (from the past 24 hour(s)).  I&O: No intake/output data recorded.   No intake/output data recorded.  Lungs: chest clear, no wheezing, rales, normal symmetric air entry, Heart exam - S1, S2 normal, no murmur, no gallop, rate regular  Heart: regular rate and rhythm, S1, S2 normal, no murmur, click, rub or gallop  Abdomen: uterine fundus firm 2 FB below umb  Perineum: is normal  Lochia: light  Extremities:no redness or tenderness in the calves or thighs, no edema    A/P: PPD # 2/ G1P1001  Doing well  Continue routine post partum orders  D/c instructions reviewed.  F/u 6 wks

## 2021-12-09 ENCOUNTER — Ambulatory Visit (INDEPENDENT_AMBULATORY_CARE_PROVIDER_SITE_OTHER): Payer: 59 | Admitting: Family Medicine

## 2021-12-09 VITALS — BP 100/62 | HR 88 | Temp 99.1°F | Wt 164.0 lb

## 2021-12-09 DIAGNOSIS — R519 Headache, unspecified: Secondary | ICD-10-CM

## 2021-12-09 DIAGNOSIS — J029 Acute pharyngitis, unspecified: Secondary | ICD-10-CM | POA: Diagnosis not present

## 2021-12-09 DIAGNOSIS — N926 Irregular menstruation, unspecified: Secondary | ICD-10-CM

## 2021-12-09 LAB — POC COVID19 BINAXNOW: SARS Coronavirus 2 Ag: NEGATIVE

## 2021-12-09 LAB — POCT RAPID STREP A (OFFICE): Rapid Strep A Screen: NEGATIVE

## 2021-12-09 LAB — POCT URINE PREGNANCY: Preg Test, Ur: NEGATIVE

## 2021-12-09 MED ORDER — KETOROLAC TROMETHAMINE 30 MG/ML IJ SOLN
30.0000 mg | Freq: Once | INTRAMUSCULAR | Status: AC
  Administered 2021-12-09: 30 mg via INTRAMUSCULAR

## 2021-12-09 NOTE — Assessment & Plan Note (Signed)
Mixed presentation not completely consistent with a migraine.  It may also be caffeine withdrawal as she notes that she has not been doing her regular caffeine intake.  She is also not drinking well could be related to hydration.  COVID and strep throat ruled out in setting of sore throat. Advised drinking water, as well as consideration for caffeine as tolerated.  She notes some pulsatile symptoms associated with position which may just be dehydration, her blood pressure is normal today.  Discussed if symptoms worsen to go to the ER and if no improvement next week to see an eye doctor for an eye exam.  Shot of Toradol given for symptomatic relief today.

## 2021-12-09 NOTE — Assessment & Plan Note (Signed)
Pregnancy test was negative today.  She is using the Nexplanon it was placed 2 years ago, discussed that this may be secondary to Nexplanon and is not uncommon.

## 2021-12-09 NOTE — Patient Instructions (Signed)
Headache - tylenol and ibuprofen as needed - try to get some rest - if worsening or new symptoms - severe neck pain, vision changes, nausea/vomiting in ability to eat or drink, fever - go to the ER - if no improvement - make an eye exam  Drink at least 64 oz of water daily  Try to get some caffeine in today

## 2021-12-09 NOTE — Progress Notes (Signed)
Subjective:     Kristen Cain is a 21 y.o. female presenting for Migraine (Started yesterday and has improved slightly today. This is the first time pt has ever had a migraine. ) and Amenorrhea     Migraine  This is a new problem. The current episode started yesterday. The problem occurs constantly. The problem has been gradually improving. The pain is located in the Bilateral and retro-orbital region. The pain does not radiate. The pain quality is not similar to prior headaches. The quality of the pain is described as pulsating. Associated symptoms include eye pain, numbness (on the temples), a sore throat, tingling (facial) and tinnitus. Pertinent negatives include no blurred vision, coughing, ear pain, fever, muscle aches, nausea, neck pain, phonophobia, photophobia, sinus pressure, swollen glands or vomiting. The symptoms are aggravated by coughing (position changes). She has tried acetaminophen and NSAIDs for the symptoms. The treatment provided mild relief. There is no history of hypertension, migraine headaches or recent head traumas.      Review of Systems  Constitutional:  Negative for fever.  HENT:  Positive for sore throat and tinnitus. Negative for ear pain and sinus pressure.   Eyes:  Positive for pain. Negative for blurred vision and photophobia.  Respiratory:  Negative for cough.   Gastrointestinal:  Negative for nausea and vomiting.  Musculoskeletal:  Negative for neck pain.  Neurological:  Positive for tingling (facial) and numbness (on the temples).     Social History   Tobacco Use  Smoking Status Never  Smokeless Tobacco Never        Objective:    BP Readings from Last 3 Encounters:  12/09/21 100/62  04/03/20 111/72  02/05/20 (!) 108/57   Wt Readings from Last 3 Encounters:  12/09/21 164 lb (74.4 kg)  04/01/20 186 lb 12.8 oz (84.7 kg)  02/05/20 180 lb (81.6 kg)    BP 100/62   Pulse 88   Temp 99.1 F (37.3 C) (Temporal)   Wt 164 lb (74.4  kg)   LMP  (LMP Unknown)   SpO2 99%   Breastfeeding No   BMI 27.29 kg/m    Physical Exam Constitutional:      General: She is not in acute distress.    Appearance: She is well-developed. She is not diaphoretic.  HENT:     Right Ear: External ear normal.     Left Ear: External ear normal.     Nose: Nose normal.     Mouth/Throat:     Mouth: Mucous membranes are moist.     Pharynx: Posterior oropharyngeal erythema present.  Eyes:     General: No scleral icterus.       Right eye: No discharge.        Left eye: No discharge.     Extraocular Movements: Extraocular movements intact.     Conjunctiva/sclera: Conjunctivae normal.     Pupils: Pupils are equal, round, and reactive to light.     Comments: Pain with eye movement  Cardiovascular:     Rate and Rhythm: Normal rate and regular rhythm.     Heart sounds: No murmur heard. Pulmonary:     Effort: Pulmonary effort is normal. No respiratory distress.     Breath sounds: Normal breath sounds.  Musculoskeletal:     Cervical back: Normal range of motion and neck supple. No pain with movement.  Skin:    General: Skin is warm and dry.     Capillary Refill: Capillary refill takes less than 2 seconds.  Neurological:     Mental Status: She is alert. Mental status is at baseline.     Cranial Nerves: Cranial nerves 2-12 are intact.     Sensory: Sensation is intact.     Motor: Motor function is intact.     Coordination: Coordination is intact.  Psychiatric:        Mood and Affect: Mood normal.        Behavior: Behavior normal.      Urine pregnancy - negative Covid neg Strep neg     Assessment & Plan:   Problem List Items Addressed This Visit       Other   Missed menses    Pregnancy test was negative today.  She is using the Nexplanon it was placed 2 years ago, discussed that this may be secondary to Nexplanon and is not uncommon.      Relevant Orders   POCT urine pregnancy (Completed)   Acute nonintractable headache -  Primary    Mixed presentation not completely consistent with a migraine.  It may also be caffeine withdrawal as she notes that she has not been doing her regular caffeine intake.  She is also not drinking well could be related to hydration.  COVID and strep throat ruled out in setting of sore throat. Advised drinking water, as well as consideration for caffeine as tolerated.  She notes some pulsatile symptoms associated with position which may just be dehydration, her blood pressure is normal today.  Discussed if symptoms worsen to go to the ER and if no improvement next week to see an eye doctor for an eye exam.  Shot of Toradol given for symptomatic relief today.      Relevant Orders   POC COVID-19 BinaxNow (Completed)   Other Visit Diagnoses     Sore throat       Relevant Orders   POCT rapid strep A (Completed)        Return if symptoms worsen or fail to improve.  Lynnda Child, MD

## 2022-03-20 IMAGING — US US OB < 14 WEEKS - US OB TV
1 series · 15 of 28 positions shown · non-contrast
Comparison: None

CLINICAL DATA: Hyperemesis gravidarum

EXAM:
OBSTETRIC <14 WK US AND TRANSVAGINAL OB US
TECHNIQUE: Both transabdominal and transvaginal ultrasound examinations were
performed for complete evaluation of the gestation as well as the
maternal uterus, adnexal regions, and pelvic cul-de-sac.
Transvaginal technique was performed to assess early pregnancy.

[Series 1: us ob < 14 weeks - us ob tv · 15 of 101 slices shown]
[im 1/101]
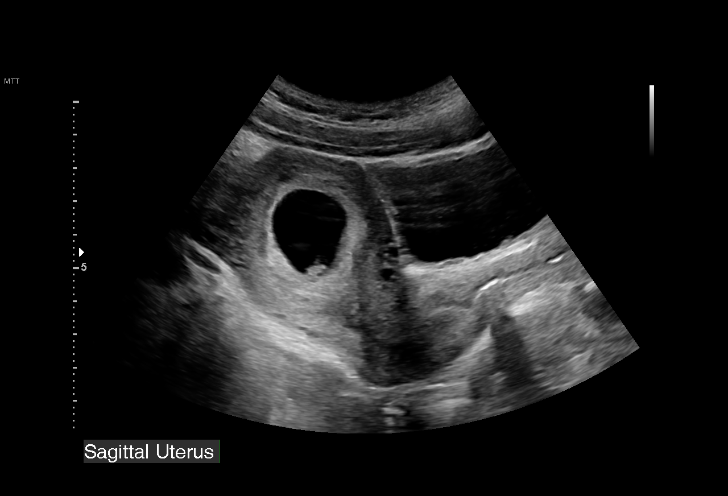
[im 8/101]
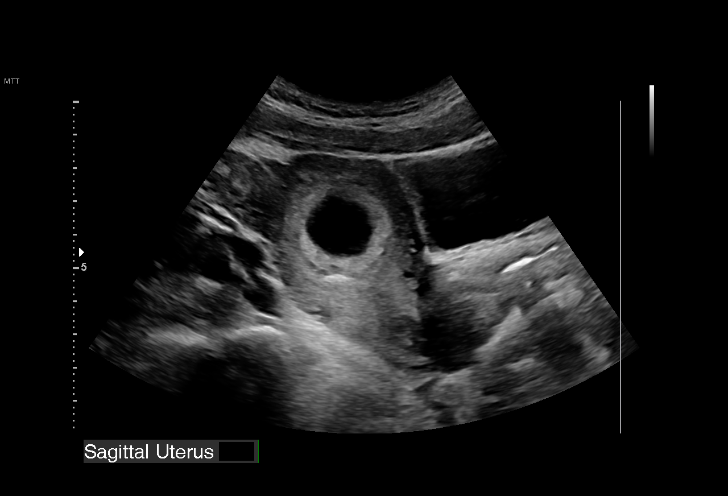
[im 15/101]
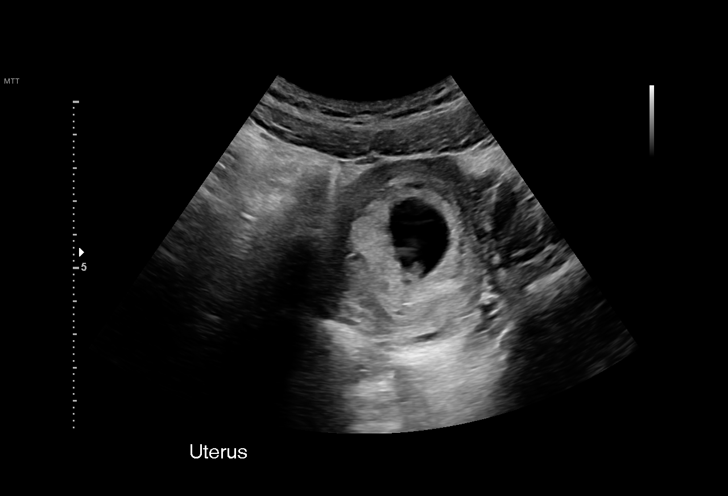
[im 23/101]
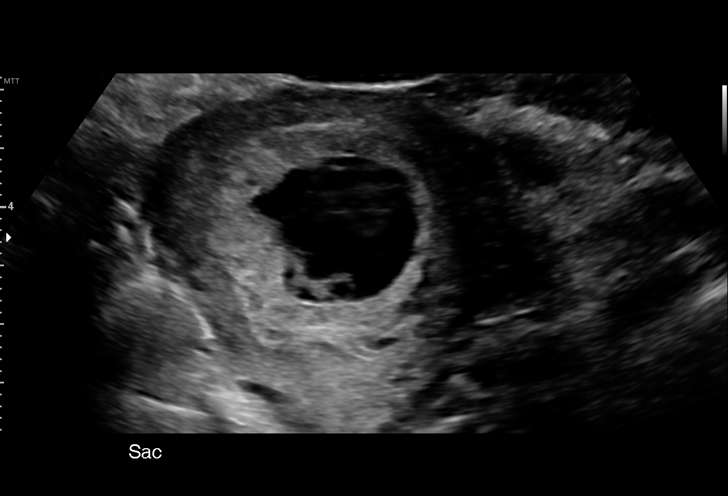
[im 30/101]
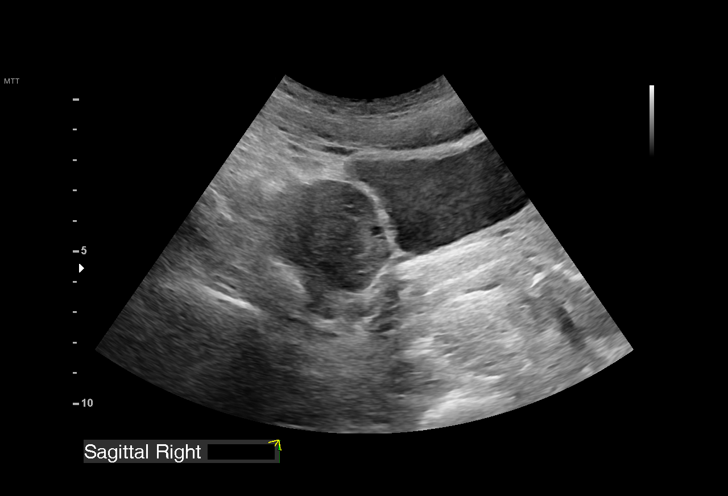
[im 38/101]
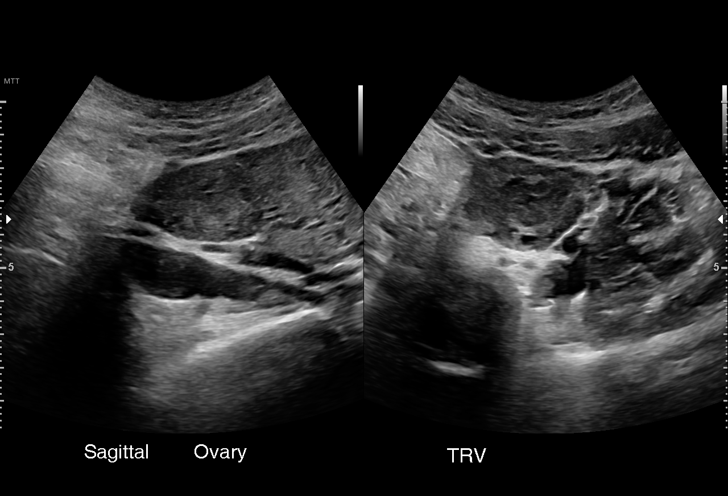
[im 45/101]
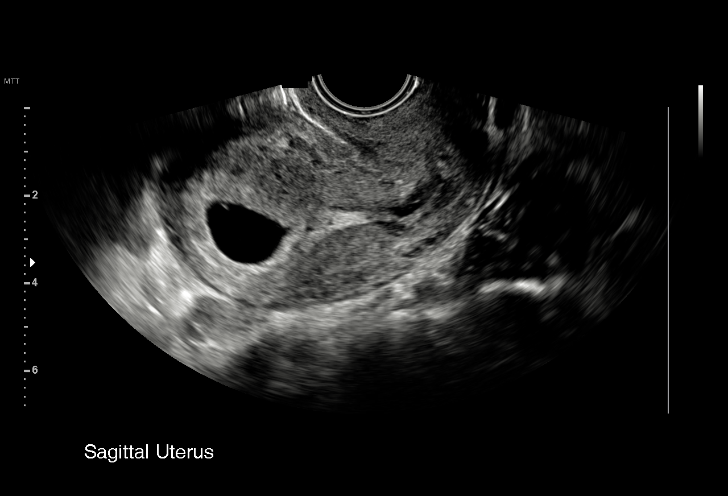
[im 52/101]
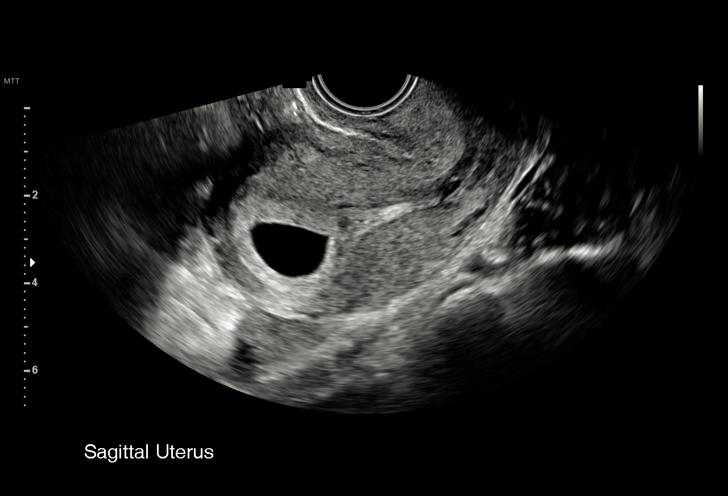
[im 56/101]
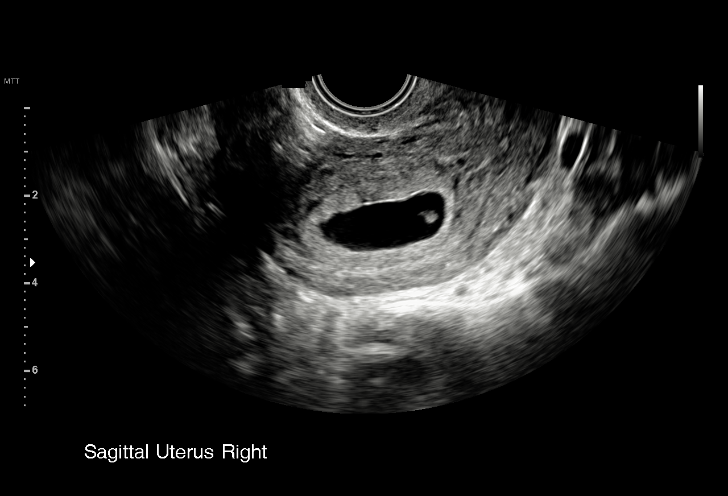
[im 63/101]
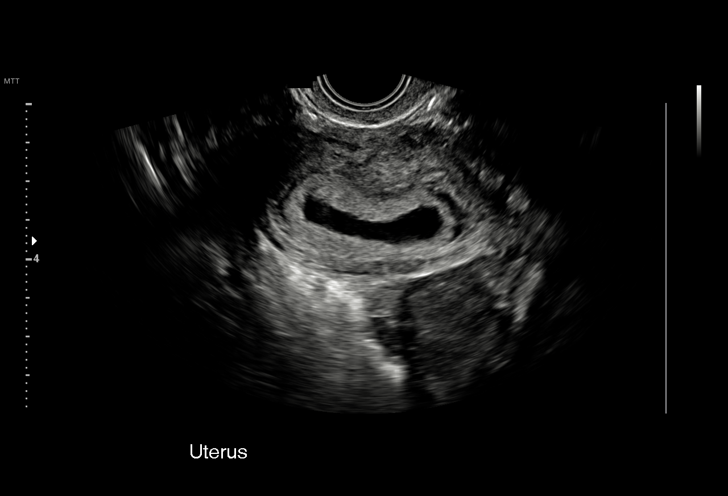
[im 71/101]
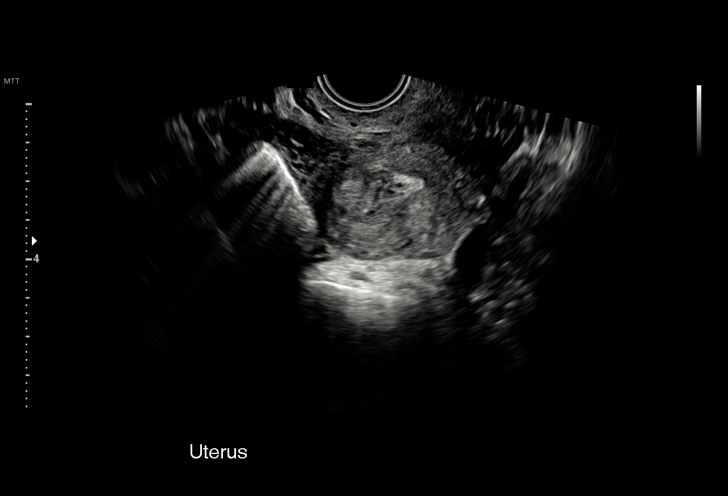
[im 78/101]
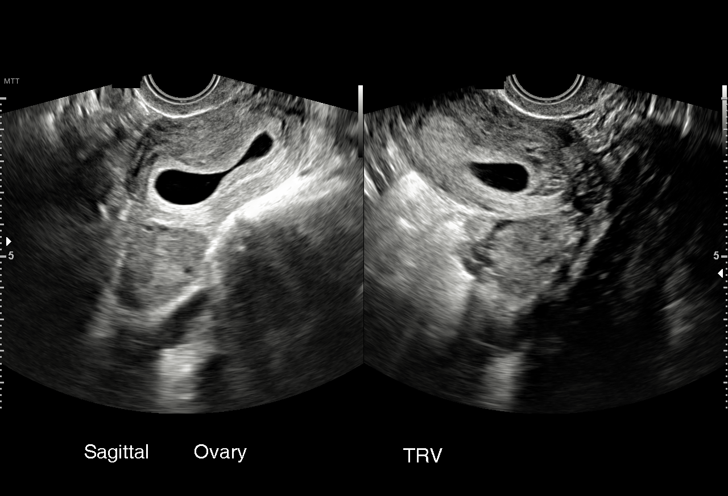
[im 86/101]
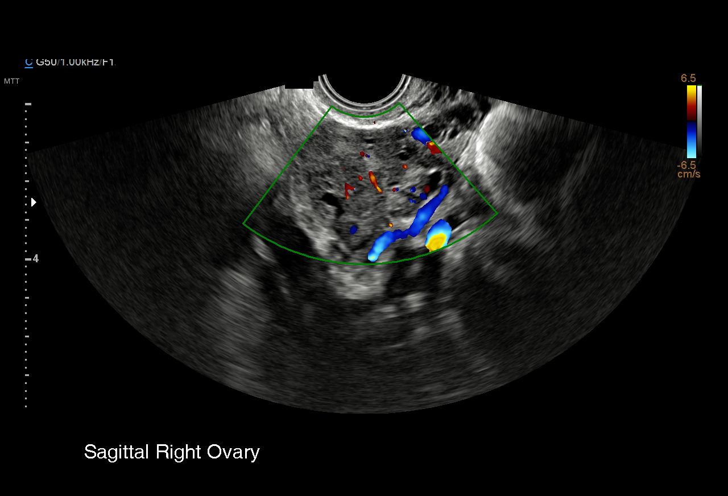
[im 93/101]
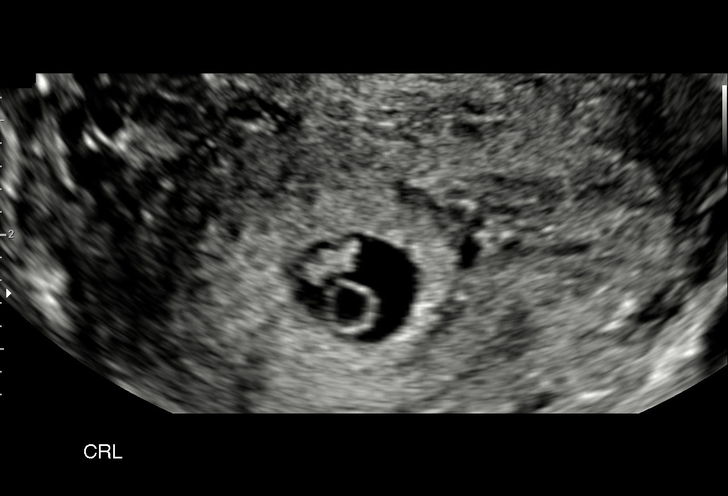
[im 101/101]
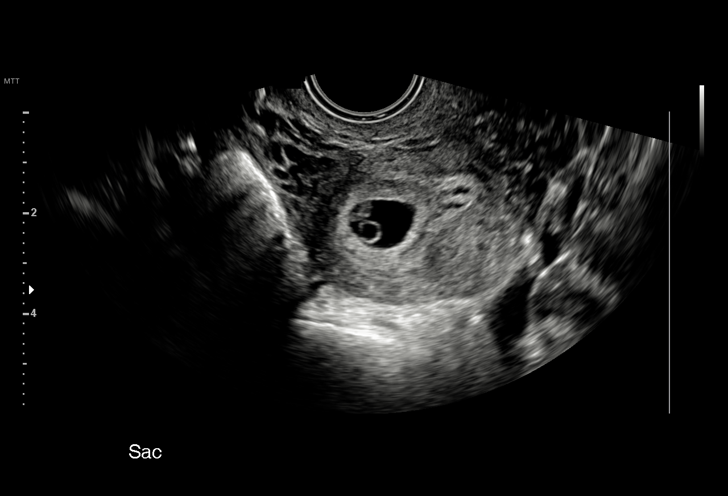

[15 of 28 positions shown; findings below may reference images not displayed]

FINDINGS: Intrauterine gestational sac: Present, single

Yolk sac:  Present

Embryo:  Present

Cardiac Activity: Present

Heart Rate: 107 bpm

CRL:  6.2 mm   6 w   3 d                  US EDC: 04/03/2020

Subchorionic hemorrhage:  Probable small subchronic hemorrhage

Maternal uterus/adnexae:

Maternal uterus otherwise normal appearance.

RIGHT ovary normal size and morphology 3.1 x 2.7 x 3.1 cm.

LEFT ovary normal size and morphology 2.7 x 2.7 x 2.2 cm.

Trace free pelvic fluid.

No adnexal masses.
IMPRESSION: Single live intrauterine gestation at 6 weeks 3 days EGA.

Probable small subchronic hemorrhage.

## 2023-01-02 ENCOUNTER — Ambulatory Visit (INDEPENDENT_AMBULATORY_CARE_PROVIDER_SITE_OTHER): Payer: No Typology Code available for payment source | Admitting: Family Medicine

## 2023-01-02 ENCOUNTER — Encounter: Payer: Self-pay | Admitting: Family Medicine

## 2023-01-02 VITALS — BP 118/68 | HR 72 | Temp 98.1°F | Ht 65.0 in | Wt 164.2 lb

## 2023-01-02 DIAGNOSIS — H5711 Ocular pain, right eye: Secondary | ICD-10-CM | POA: Insufficient documentation

## 2023-01-02 DIAGNOSIS — G245 Blepharospasm: Secondary | ICD-10-CM | POA: Diagnosis not present

## 2023-01-02 NOTE — Patient Instructions (Addendum)
Keep dialing back caffine slowly  Drink lots of water   Try to take breaks from screens  If you have dry eyes - a lubricating drop over the counter is helpful   I placed a referral to optometry  Go ahead and call Dr Leonides Cave office to make an appt  I do wonder if you need glasses for vision/eye strain   Let us know if no improvement

## 2023-01-02 NOTE — Assessment & Plan Note (Signed)
Occational  May be from strain /fatigue  Also twitch -not always  See a/p for twitch Ref to opt

## 2023-01-02 NOTE — Assessment & Plan Note (Signed)
Right eye twitches Occational dull pain but no light sensitivity or pain with movement  Episodes are brief  Can be associated with screen time and eye fatigue and occational dry eyes  Is trying to dial back her caffeine   Normal exam today Ref to optometry for further eval and vision exam  Suspect eye fatigue plays role Discussed need for breaks from screens and taking time to look up  Will watch for redness/swelling /headache or vision change Update if not starting to improve in a week or if worsening  Call back and Er precautions noted in detail today

## 2023-01-02 NOTE — Progress Notes (Signed)
Subjective:    Patient ID: Kristen Cain, female    DOB: February 06, 2001, 22 y.o.   MRN: [id number]  HPI  Wt Readings from Last 3 Encounters:  01/02/23 164 lb 4 oz (74.5 kg)  12/09/21 164 lb (74.4 kg)  04/01/20 186 lb 12.8 oz (84.7 kg)   27.33 kg/m  Vitals:   01/02/23 1159  BP: 118/68  Pulse: 72  Temp: 98.1 F (36.7 C)  SpO2: 98%   Pt presents with c/o right eye twitching and sometimes pain   Random pain- dull in the eye / makes it hurt to move eye  No eye redness or swelling or tearing   Right eye twitches on and off-is bothersome - few minutes at a time  Vision is ok overall  Has a history of migraines -no longer having   No light sensitivity   Alcohol- not regular  Occational binge    A lot of screen time  Does squint frequently  Occational has to rub eyes   Caffeine - one ice coffee (large) every am  One soda occational during the day   Eats fair  Not perfect  Not a lot of time  No vitamins    Patient Active Problem List   Diagnosis Date Noted   Eye twitch 01/02/2023   Pain of right eye 01/02/2023   Acute nonintractable headache 12/09/2021   Tachycardia 09/08/2019   Viral URI with cough 08/04/2019   Missed menses 08/04/2019   Epidermal cyst of face 04/08/2019   Eczema of both hands 03/27/2019   Acne vulgaris 03/27/2019   Routine general medical examination at a health care facility 01/02/2014   Screen for STD (sexually transmitted disease) 01/02/2014   Menorrhagia with irregular cycle 01/02/2014   PES PLANUS 11/29/2009   Past Medical History:  Diagnosis Date   as a child    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)    Past Surgical History:  Procedure Laterality Date   toe nail     Social History   Tobacco Use   Smoking status: Never   Smokeless tobacco: Never  Vaping Use   Vaping status: Never Used  Substance Use Topics   Alcohol use: Not Currently    Alcohol/week: 0.0 standard drinks of alcohol    Comment: occ   Drug use: Yes    Types: Marijuana     Comment: a few days ago   Family History  Problem Relation Age of Onset   Hypertension Father    Hypertension Mother    Diabetes Mother        borderline   No Known Allergies Current Outpatient Medications on File Prior to Visit  Medication Sig Dispense Refill   etonogestrel (NEXPLANON) 68 MG IMPL implant 1 each by Subdermal route once. Put in in 2021     No current facility-administered medications on file prior to visit.    Review of Systems  Constitutional:  Negative for activity change, appetite change, fatigue, fever and unexpected weight change.  HENT:  Negative for congestion, ear pain, rhinorrhea, sinus pressure and sore throat.   Eyes:  Positive for pain. Negative for photophobia, discharge, redness, itching and visual disturbance.  Respiratory:  Negative for cough, shortness of breath and wheezing.   Cardiovascular:  Negative for chest pain and palpitations.  Gastrointestinal:  Negative for abdominal pain, blood in stool, constipation and diarrhea.  Endocrine: Negative for polydipsia and polyuria.  Genitourinary:  Negative for dysuria, frequency and urgency.  Musculoskeletal:  Negative for arthralgias, back pain and myalgias.  Skin:  Negative for pallor and rash.  Allergic/Immunologic: Negative for environmental allergies.  Neurological:  Negative for dizziness, tremors, syncope, speech difficulty, weakness, light-headedness, numbness and headaches.  Hematological:  Negative for adenopathy. Does not bruise/bleed easily.  Psychiatric/Behavioral:  Negative for decreased concentration and dysphoric mood. The patient is not nervous/anxious.        Objective:   Physical Exam Constitutional:      General: She is not in acute distress.    Appearance: Normal appearance. She is normal weight. She is not ill-appearing.  HENT:     Head: Normocephalic and atraumatic.     Right Ear: Tympanic membrane and ear canal normal.     Left Ear: Tympanic membrane and ear canal  normal.     Nose: Nose normal. No congestion.     Mouth/Throat:     Mouth: Mucous membranes are moist.     Pharynx: Oropharynx is clear.  Eyes:     General: No scleral icterus.       Right eye: No discharge.        Left eye: No discharge.     Extraocular Movements: Extraocular movements intact.     Conjunctiva/sclera: Conjunctivae normal.     Pupils: Pupils are equal, round, and reactive to light.  Neck:     Vascular: No carotid bruit.  Cardiovascular:     Rate and Rhythm: Normal rate and regular rhythm.     Heart sounds: Normal heart sounds.  Pulmonary:     Effort: Pulmonary effort is normal. No respiratory distress.     Breath sounds: Normal breath sounds.  Musculoskeletal:     Cervical back: Neck supple. No tenderness.  Lymphadenopathy:     Cervical: No cervical adenopathy.  Skin:    Coloration: Skin is not jaundiced.     Findings: No erythema, lesion or rash.  Neurological:     Mental Status: She is alert.     Cranial Nerves: No cranial nerve deficit or facial asymmetry.     Sensory: No sensory deficit.     Motor: No tremor.     Coordination: Coordination normal.     Deep Tendon Reflexes: Reflexes normal.  Psychiatric:        Mood and Affect: Mood normal.           Assessment & Plan:   Problem List Items Addressed This Visit       Nervous and Auditory   Eye twitch - Primary    Right eye twitches Occational dull pain but no light sensitivity or pain with movement  Episodes are brief  Can be associated with screen time and eye fatigue and occational dry eyes  Is trying to dial back her caffeine   Normal exam today Ref to optometry for further eval and vision exam  Suspect eye fatigue plays role Discussed need for breaks from screens and taking time to look up  Will watch for redness/swelling /headache or vision change Update if not starting to improve in a week or if worsening  Call back and Er precautions noted in detail today        Relevant  Orders   Ambulatory referral to Optometry     Other   Pain of right eye    Occational  May be from strain /fatigue  Also twitch -not always  See a/p for twitch Ref to opt      Relevant Orders   Ambulatory referral to Optometry

## 2023-06-04 ENCOUNTER — Ambulatory Visit: Payer: No Typology Code available for payment source | Admitting: Family Medicine

## 2023-08-16 ENCOUNTER — Ambulatory Visit: Admitting: Family Medicine
